# Patient Record
Sex: Male | Born: 1945 | Race: White | Hispanic: No | Marital: Married | Smoking: Former smoker
Health system: Southern US, Community
[De-identification: ages and names within clinical notes are randomized; demographics above are authoritative.]

## PROBLEM LIST (undated history)

## (undated) DIAGNOSIS — E119 Type 2 diabetes mellitus without complications: Secondary | ICD-10-CM

## (undated) DIAGNOSIS — J42 Unspecified chronic bronchitis: Secondary | ICD-10-CM

## (undated) DIAGNOSIS — E7800 Pure hypercholesterolemia, unspecified: Secondary | ICD-10-CM

## (undated) DIAGNOSIS — I10 Essential (primary) hypertension: Secondary | ICD-10-CM

## (undated) DIAGNOSIS — G629 Polyneuropathy, unspecified: Secondary | ICD-10-CM

## (undated) DIAGNOSIS — B1920 Unspecified viral hepatitis C without hepatic coma: Secondary | ICD-10-CM

## (undated) HISTORY — PX: BACK SURGERY: SHX140

## (undated) HISTORY — DX: Type 2 diabetes mellitus without complications: E11.9

## (undated) HISTORY — PX: SHOULDER SURGERY: SHX246

## (undated) HISTORY — DX: Pure hypercholesterolemia, unspecified: E78.00

## (undated) HISTORY — PX: CHOLECYSTECTOMY: SHX55

## (undated) HISTORY — PX: SPINAL CORD STIMULATOR INSERTION: SHX5378

---

## 1999-10-04 ENCOUNTER — Encounter: Payer: Self-pay | Admitting: Emergency Medicine

## 1999-10-04 ENCOUNTER — Emergency Department (HOSPITAL_COMMUNITY): Admission: EM | Admit: 1999-10-04 | Discharge: 1999-10-04 | Payer: Self-pay | Admitting: Emergency Medicine

## 1999-10-27 ENCOUNTER — Encounter: Payer: Self-pay | Admitting: Orthopedic Surgery

## 1999-10-29 ENCOUNTER — Observation Stay (HOSPITAL_COMMUNITY): Admission: RE | Admit: 1999-10-29 | Discharge: 1999-10-30 | Payer: Self-pay | Admitting: Orthopedic Surgery

## 2000-02-23 ENCOUNTER — Observation Stay (HOSPITAL_COMMUNITY): Admission: RE | Admit: 2000-02-23 | Discharge: 2000-02-24 | Payer: Self-pay | Admitting: Orthopedic Surgery

## 2001-01-14 ENCOUNTER — Emergency Department (HOSPITAL_COMMUNITY): Admission: EM | Admit: 2001-01-14 | Discharge: 2001-01-14 | Payer: Self-pay | Admitting: Emergency Medicine

## 2001-04-28 ENCOUNTER — Encounter: Payer: Self-pay | Admitting: Orthopedic Surgery

## 2001-04-28 ENCOUNTER — Encounter (INDEPENDENT_AMBULATORY_CARE_PROVIDER_SITE_OTHER): Payer: Self-pay | Admitting: Specialist

## 2001-04-28 ENCOUNTER — Observation Stay (HOSPITAL_COMMUNITY): Admission: RE | Admit: 2001-04-28 | Discharge: 2001-04-30 | Payer: Self-pay | Admitting: Orthopedic Surgery

## 2001-11-07 ENCOUNTER — Encounter: Payer: Self-pay | Admitting: Orthopaedic Surgery

## 2001-11-07 ENCOUNTER — Encounter: Admission: RE | Admit: 2001-11-07 | Discharge: 2001-11-07 | Payer: Self-pay | Admitting: Orthopaedic Surgery

## 2001-11-29 ENCOUNTER — Encounter: Payer: Self-pay | Admitting: Orthopaedic Surgery

## 2001-11-29 ENCOUNTER — Encounter: Admission: RE | Admit: 2001-11-29 | Discharge: 2001-11-29 | Payer: Self-pay | Admitting: Orthopaedic Surgery

## 2002-04-02 ENCOUNTER — Encounter: Admission: RE | Admit: 2002-04-02 | Discharge: 2002-04-17 | Payer: Self-pay

## 2002-04-30 ENCOUNTER — Ambulatory Visit (HOSPITAL_COMMUNITY): Admission: RE | Admit: 2002-04-30 | Discharge: 2002-04-30 | Payer: Self-pay | Admitting: *Deleted

## 2002-04-30 ENCOUNTER — Encounter: Payer: Self-pay | Admitting: *Deleted

## 2002-05-08 ENCOUNTER — Encounter: Payer: Self-pay | Admitting: *Deleted

## 2002-05-08 ENCOUNTER — Ambulatory Visit (HOSPITAL_COMMUNITY): Admission: RE | Admit: 2002-05-08 | Discharge: 2002-05-08 | Payer: Self-pay | Admitting: *Deleted

## 2002-05-14 ENCOUNTER — Encounter: Payer: Self-pay | Admitting: *Deleted

## 2002-05-14 ENCOUNTER — Ambulatory Visit (HOSPITAL_COMMUNITY): Admission: RE | Admit: 2002-05-14 | Discharge: 2002-05-14 | Payer: Self-pay | Admitting: *Deleted

## 2002-06-11 ENCOUNTER — Encounter: Admission: RE | Admit: 2002-06-11 | Discharge: 2002-09-09 | Payer: Self-pay | Admitting: Anesthesiology

## 2002-07-04 ENCOUNTER — Encounter: Payer: Self-pay | Admitting: *Deleted

## 2002-07-04 ENCOUNTER — Ambulatory Visit (HOSPITAL_COMMUNITY): Admission: RE | Admit: 2002-07-04 | Discharge: 2002-07-04 | Payer: Self-pay | Admitting: *Deleted

## 2002-07-11 ENCOUNTER — Encounter: Payer: Self-pay | Admitting: *Deleted

## 2002-07-11 ENCOUNTER — Ambulatory Visit (HOSPITAL_COMMUNITY): Admission: RE | Admit: 2002-07-11 | Discharge: 2002-07-11 | Payer: Self-pay | Admitting: *Deleted

## 2002-07-27 ENCOUNTER — Encounter: Payer: Self-pay | Admitting: Internal Medicine

## 2002-07-27 ENCOUNTER — Encounter: Admission: RE | Admit: 2002-07-27 | Discharge: 2002-07-27 | Payer: Self-pay | Admitting: Internal Medicine

## 2002-09-20 ENCOUNTER — Ambulatory Visit (HOSPITAL_COMMUNITY): Admission: RE | Admit: 2002-09-20 | Discharge: 2002-09-20 | Payer: Self-pay | Admitting: *Deleted

## 2002-09-20 ENCOUNTER — Encounter: Payer: Self-pay | Admitting: *Deleted

## 2002-11-16 ENCOUNTER — Encounter: Payer: Self-pay | Admitting: Gastroenterology

## 2002-11-16 ENCOUNTER — Ambulatory Visit (HOSPITAL_COMMUNITY): Admission: RE | Admit: 2002-11-16 | Discharge: 2002-11-16 | Payer: Self-pay | Admitting: Gastroenterology

## 2002-12-30 ENCOUNTER — Encounter: Admission: RE | Admit: 2002-12-30 | Discharge: 2002-12-30 | Payer: Self-pay | Admitting: Internal Medicine

## 2002-12-30 ENCOUNTER — Encounter: Payer: Self-pay | Admitting: Internal Medicine

## 2003-04-05 ENCOUNTER — Encounter: Payer: Self-pay | Admitting: *Deleted

## 2003-04-05 ENCOUNTER — Ambulatory Visit (HOSPITAL_COMMUNITY): Admission: RE | Admit: 2003-04-05 | Discharge: 2003-04-05 | Payer: Self-pay | Admitting: *Deleted

## 2003-08-07 ENCOUNTER — Ambulatory Visit (HOSPITAL_COMMUNITY): Admission: RE | Admit: 2003-08-07 | Discharge: 2003-08-07 | Payer: Self-pay | Admitting: *Deleted

## 2003-08-12 ENCOUNTER — Ambulatory Visit (HOSPITAL_COMMUNITY): Admission: RE | Admit: 2003-08-12 | Discharge: 2003-08-13 | Payer: Self-pay | Admitting: *Deleted

## 2003-08-21 ENCOUNTER — Inpatient Hospital Stay (HOSPITAL_COMMUNITY): Admission: EM | Admit: 2003-08-21 | Discharge: 2003-08-23 | Payer: Self-pay | Admitting: Emergency Medicine

## 2003-08-24 ENCOUNTER — Ambulatory Visit (HOSPITAL_COMMUNITY): Admission: RE | Admit: 2003-08-24 | Discharge: 2003-08-24 | Payer: Self-pay | Admitting: *Deleted

## 2003-08-26 ENCOUNTER — Encounter (HOSPITAL_COMMUNITY): Admission: RE | Admit: 2003-08-26 | Discharge: 2003-09-16 | Payer: Self-pay | Admitting: *Deleted

## 2003-09-17 ENCOUNTER — Encounter: Admission: RE | Admit: 2003-09-17 | Discharge: 2003-09-30 | Payer: Self-pay | Admitting: *Deleted

## 2003-09-19 ENCOUNTER — Ambulatory Visit (HOSPITAL_COMMUNITY): Admission: RE | Admit: 2003-09-19 | Discharge: 2003-09-19 | Payer: Self-pay | Admitting: *Deleted

## 2003-09-25 ENCOUNTER — Ambulatory Visit (HOSPITAL_COMMUNITY): Admission: RE | Admit: 2003-09-25 | Discharge: 2003-09-25 | Payer: Self-pay | Admitting: *Deleted

## 2003-12-17 ENCOUNTER — Ambulatory Visit (HOSPITAL_COMMUNITY): Admission: RE | Admit: 2003-12-17 | Discharge: 2003-12-17 | Payer: Self-pay | Admitting: *Deleted

## 2004-04-21 ENCOUNTER — Ambulatory Visit: Payer: Self-pay | Admitting: Physician Assistant

## 2004-04-28 ENCOUNTER — Ambulatory Visit: Payer: Self-pay | Admitting: Pain Medicine

## 2004-05-04 ENCOUNTER — Ambulatory Visit: Payer: Self-pay | Admitting: Pain Medicine

## 2004-05-22 ENCOUNTER — Ambulatory Visit: Payer: Self-pay | Admitting: Physician Assistant

## 2004-06-23 ENCOUNTER — Ambulatory Visit: Payer: Self-pay | Admitting: Physician Assistant

## 2004-07-09 ENCOUNTER — Ambulatory Visit: Payer: Self-pay | Admitting: Physician Assistant

## 2004-07-21 ENCOUNTER — Ambulatory Visit: Payer: Self-pay | Admitting: Pain Medicine

## 2004-08-27 ENCOUNTER — Ambulatory Visit: Payer: Self-pay | Admitting: Physician Assistant

## 2004-09-20 ENCOUNTER — Emergency Department (HOSPITAL_COMMUNITY): Admission: EM | Admit: 2004-09-20 | Discharge: 2004-09-20 | Payer: Self-pay | Admitting: Emergency Medicine

## 2004-09-29 ENCOUNTER — Ambulatory Visit (HOSPITAL_COMMUNITY): Admission: RE | Admit: 2004-09-29 | Discharge: 2004-09-29 | Payer: Self-pay | Admitting: Internal Medicine

## 2004-10-12 ENCOUNTER — Ambulatory Visit: Payer: Self-pay | Admitting: Pain Medicine

## 2004-10-15 ENCOUNTER — Ambulatory Visit: Payer: Self-pay | Admitting: Pain Medicine

## 2004-11-17 ENCOUNTER — Ambulatory Visit: Payer: Self-pay | Admitting: Physician Assistant

## 2004-11-26 ENCOUNTER — Ambulatory Visit: Payer: Self-pay | Admitting: Pain Medicine

## 2004-12-17 ENCOUNTER — Ambulatory Visit: Payer: Self-pay | Admitting: Physician Assistant

## 2005-01-15 ENCOUNTER — Ambulatory Visit: Payer: Self-pay | Admitting: Physician Assistant

## 2005-02-15 ENCOUNTER — Ambulatory Visit: Payer: Self-pay | Admitting: Physician Assistant

## 2005-02-26 ENCOUNTER — Ambulatory Visit: Payer: Self-pay | Admitting: Pain Medicine

## 2005-03-15 ENCOUNTER — Ambulatory Visit: Payer: Self-pay | Admitting: Physician Assistant

## 2005-04-20 ENCOUNTER — Ambulatory Visit: Payer: Self-pay | Admitting: Physician Assistant

## 2005-04-29 ENCOUNTER — Ambulatory Visit: Payer: Self-pay | Admitting: Pain Medicine

## 2005-05-12 ENCOUNTER — Ambulatory Visit: Payer: Self-pay | Admitting: Pain Medicine

## 2005-05-25 ENCOUNTER — Ambulatory Visit: Payer: Self-pay | Admitting: Pain Medicine

## 2005-06-09 ENCOUNTER — Ambulatory Visit: Payer: Self-pay | Admitting: Physician Assistant

## 2005-07-08 ENCOUNTER — Ambulatory Visit: Payer: Self-pay | Admitting: Physician Assistant

## 2005-08-06 ENCOUNTER — Ambulatory Visit: Payer: Self-pay | Admitting: Physician Assistant

## 2005-09-06 ENCOUNTER — Ambulatory Visit: Payer: Self-pay | Admitting: Physician Assistant

## 2005-10-01 IMAGING — CT CT ANGIO ABDOMEN
1 of 4 series · 12 of 32 positions shown, 17 images · IV contrast (omnipaque)
Comparison: None.

CLINICAL DATA: One week post-back surgery at L4-5 with fever.
CT ANGIO OF THE ABDOMEN ([DATE])
TECHNIQUE: After the intravenous injection of 150 cc of Omnipaque 350, 1 mm spiral images were obtained through the abdomen and pelvis.  The images were reconstructed in 3-D and multiplanar views.

[Series 104: renal/sma cta · axial · 0.79mm/px · z∈[-427,-95]mm · 12 of 610 slices shown, 17 images]
[im 41/610  soft-tissue]
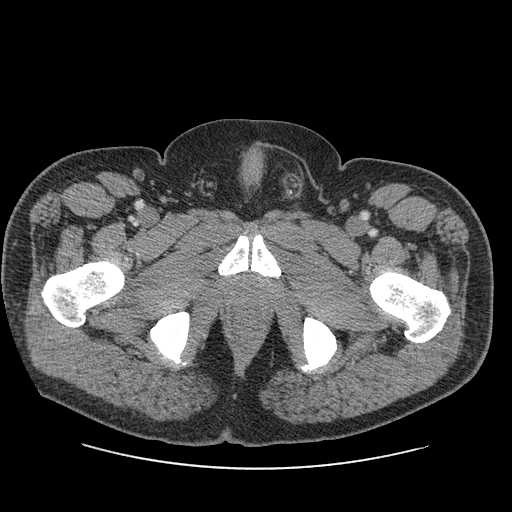
[im 41/610  bone]
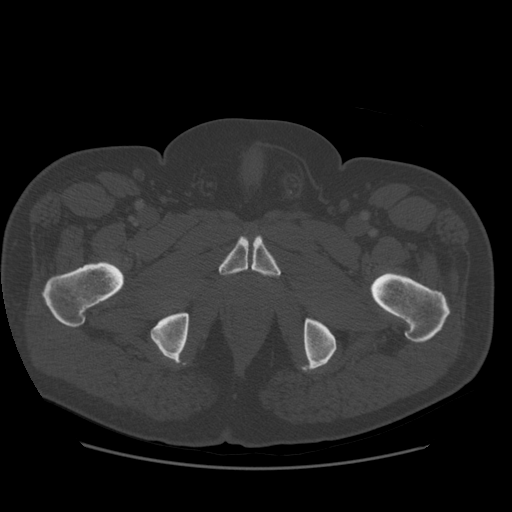
[im 82/610  soft-tissue]
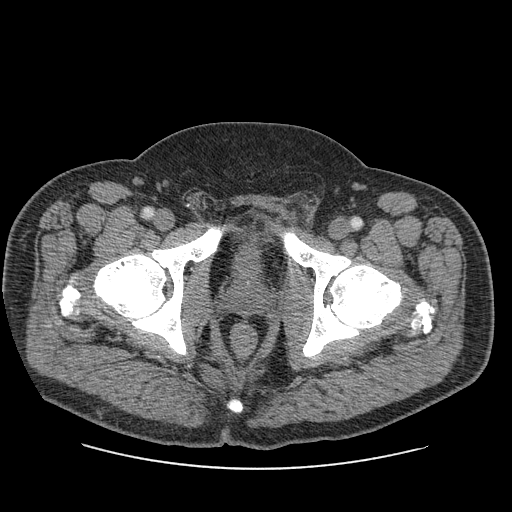
[im 163/610  soft-tissue]
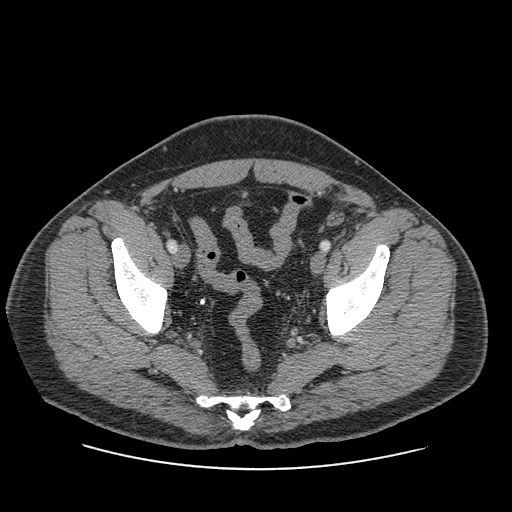
[im 204/610  soft-tissue]
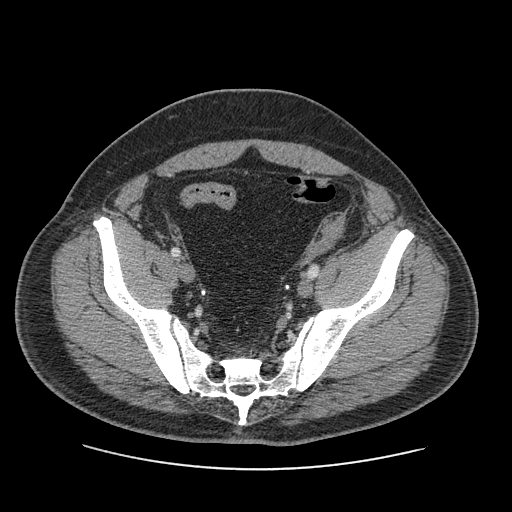
[im 244/610  soft-tissue]
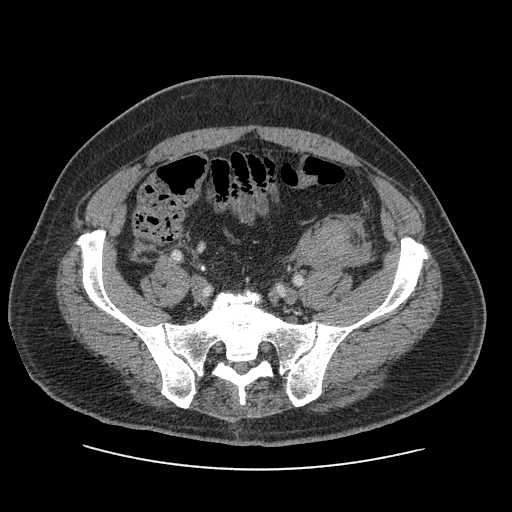
[im 325/610  soft-tissue]
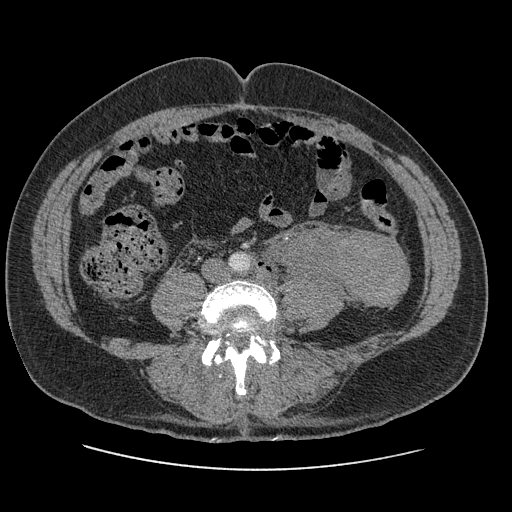
[im 366/610  soft-tissue]
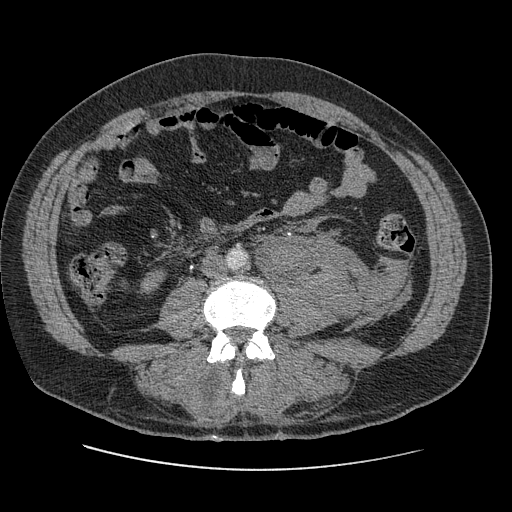
[im 407/610  soft-tissue]
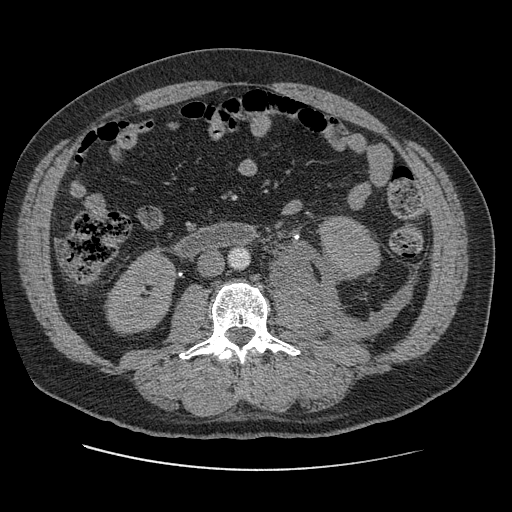
[im 447/610  soft-tissue]
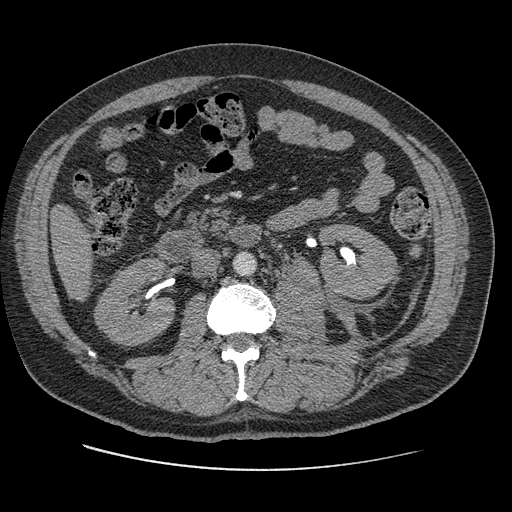
[im 447/610  lung]
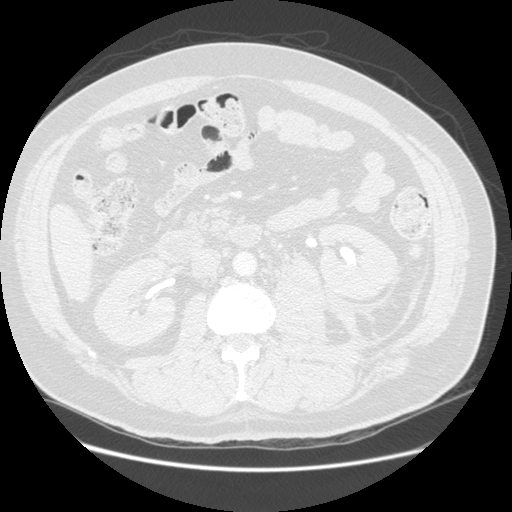
[im 447/610  bone]
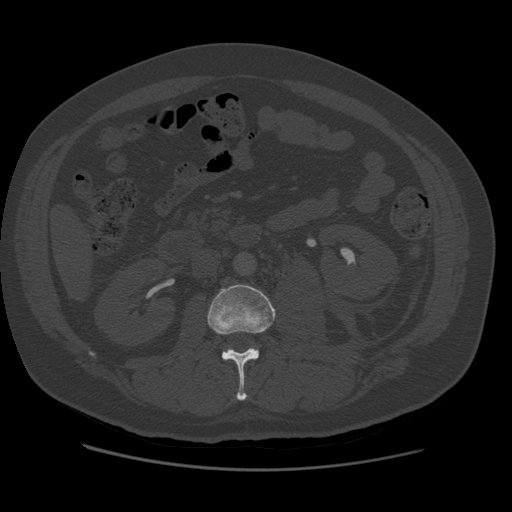
[im 488/610  lung]
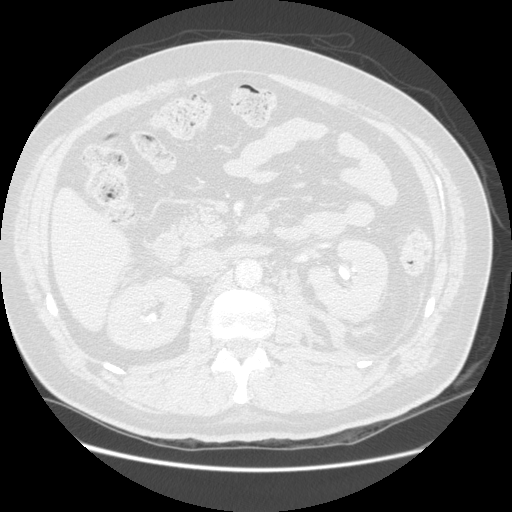
[im 528/610  soft-tissue]
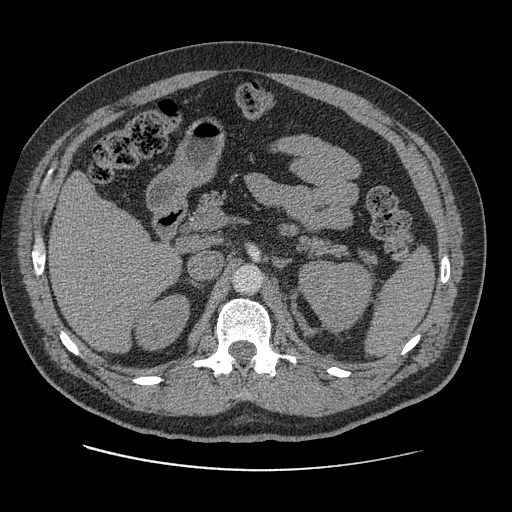
[im 528/610  lung]
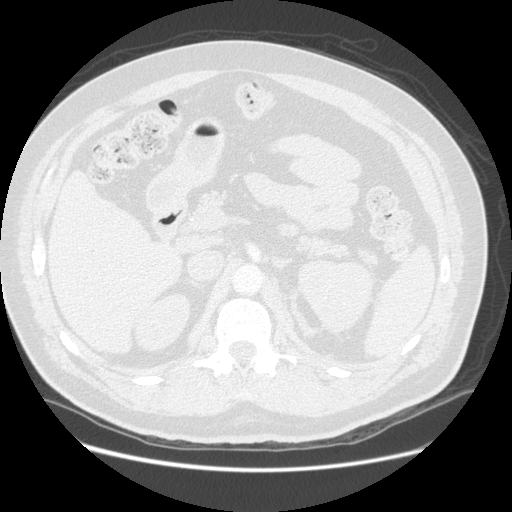
[im 569/610  soft-tissue]
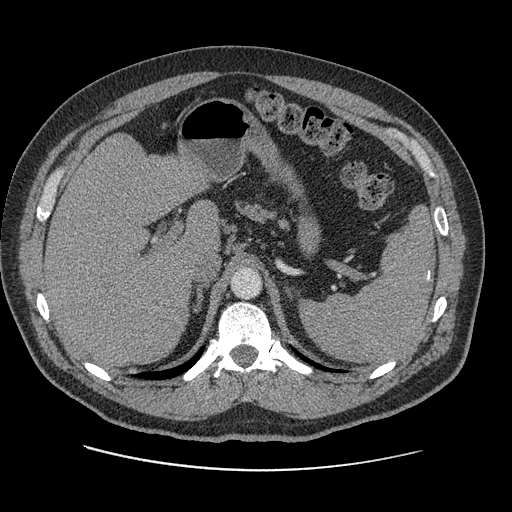
[im 569/610  lung]
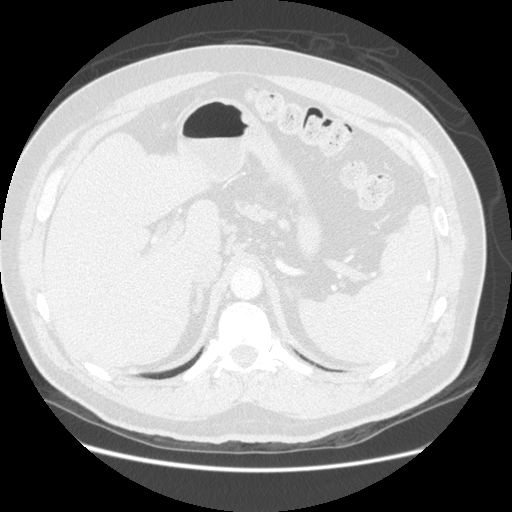

[12 of 32 positions shown; findings below may reference images not displayed]

FINDINGS: There is poor opacification of the arterial system significantly limiting this study.  The SMA, celiac, and IMA are patent.  Single renal arteries are patent bilaterally.  The aorta and iliac arteries are patent.  
There is a retroperitoneal hematoma involving primarily the left retroperitoneum and anterior to the left psoas muscle extending to the left kidney.  The soft tissue stranding associated with the hematoma is also in the left para-aortic region and contiguous with the left common iliac artery.  Small areas of gas are seen within this stranding in the left para-aortic region on image 288 of the 1.25 mm section sequence.  Due to poor opacification, pseudoaneurysm from a small lumbar artery cannot be entirely excluded.  In addition, abrupt occlusion of one of the lumbar arteries cannot be determined.  There is no obvious pseudoaneurysm.  There is no obvious contrast extravasation.  The stranding and hematoma also does extend to the left common iliac vein.  A hematoma measures 12.0 x 5.5 cm on image 297.    The left retroperitoneal hematoma is 12.0 x 5.5 cm on image 279 of the 1 mm sequence.  
There is an approximately 1.2 x 0.4 cm metallic object anterior and to the left of the left L4-5 disc space.  The metallic object is in close proximity to the left common iliac vein and is just deep and posterior to the left common iliac artery.  Stranding is seen about this area compatible with hematoma.  A small amount of gas is also seen which may simply represent post-surgical change.  Post-surgical changes are noted in the posterior elements and discs at L4-5.
The visualized portions of the liver, spleen, adrenal glands, pancreas are within normal limits.  There is a 1.6 x 1.0 cm hypodensity in the lower pole of the right kidney on image 365.  It is somewhat oblong.  Hounsfield measurements are approximately 40.  This may represent a hyperdense cyst.  However, renal mass is not excluded.  A subcentimeter hypodensity is seen in the upper pole of the left kidney on image 327, too small to characterize.  On precontrast images, the hypodensity in the lower pole of the right kidney has Hounsfield unit measurements of approximately 40.  This is most compatible with a hyperdense cyst.
IMPRESSION 
Postsurgical change at L4-5.
Retroperitoneal hematoma.  Vascular opacification is limited, and although no obvious pseudoaneurysm or extravasation is seen, subtle pseudoaneurysm or subtle contrast extravasation cannot be entirely excluded.  Conventional digital subtraction angiography can further delineate pathology.  The hematoma does come into close proximity of the left common iliac artery and the lumbar arteries as well as associated venous structures in the left para-aortic region.
Small metallic object anterior to the L4-5 disc space.  This is just deep to the left common iliac artery and is also just lateral to the left common iliac vein.
Hyperdense cyst in the lower pole of the right kidney.  Follow-up in 6 months is recommended to ensure stability.
These findings were discussed with Dr. Selma Mellen.

## 2005-10-01 IMAGING — CT CT L SPINE W/O CM
2 series · 10 of 14 positions shown, 12 images · non-contrast
Comparison: none

CLINICAL DATA: One week post-back surgery at L4-5 with fever. 
CT LUMBAR SPINE WITHOUT CONTRAST ([DATE])
TECHNIQUE: 2.5 mm spiral images were obtained through the lumbar spine.

[Series 2: l-spine · axial · 0.27mm/px · z∈[-376,-244]mm · 5 of 80 slices shown, 7 images]
[im 14/80  soft-tissue]
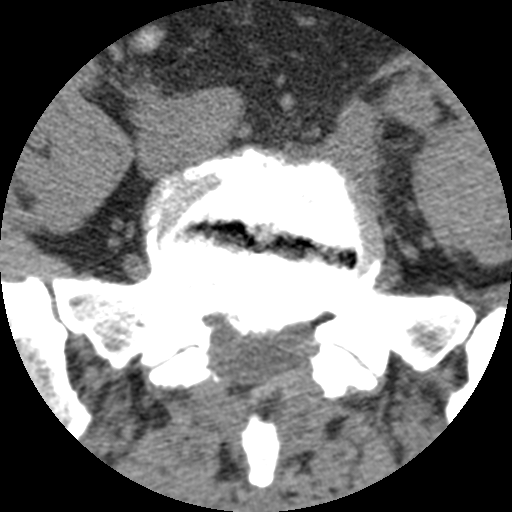
[im 14/80  bone]
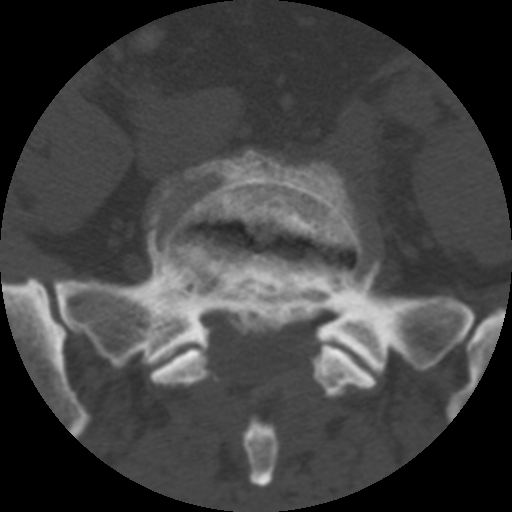
[im 27/80  bone]
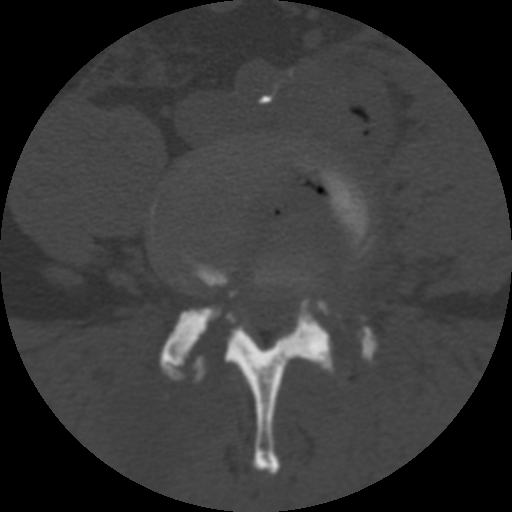
[im 40/80  bone]
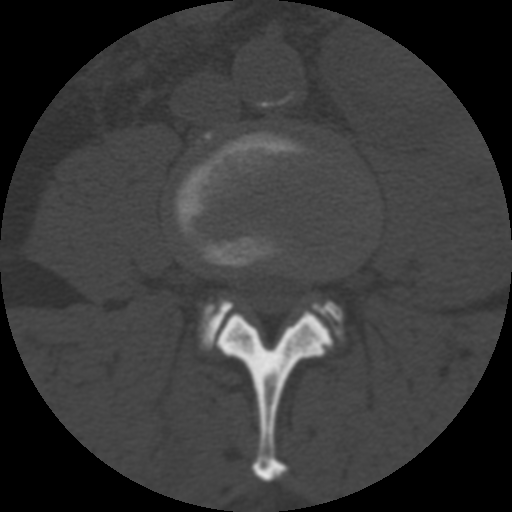
[im 53/80  bone]
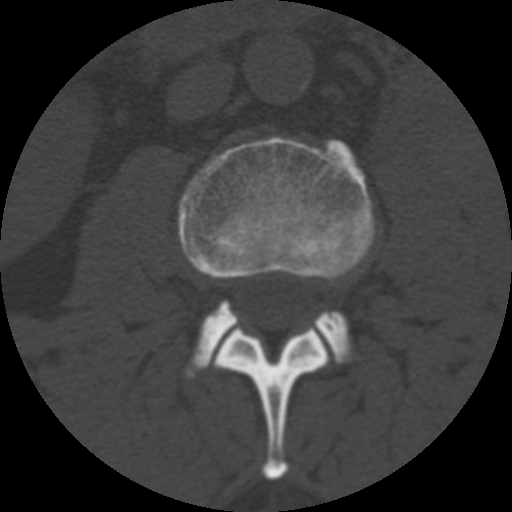
[im 66/80  soft-tissue]
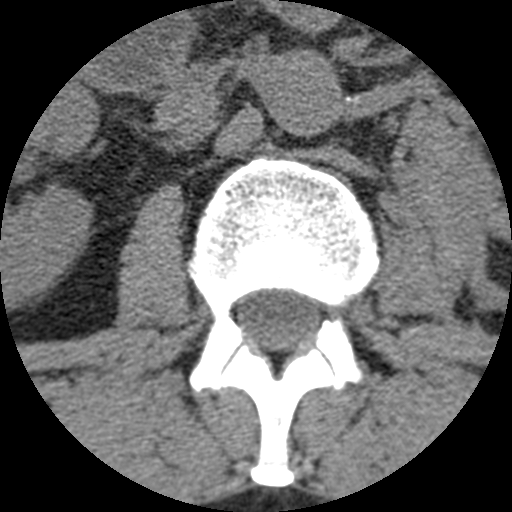
[im 66/80  bone]
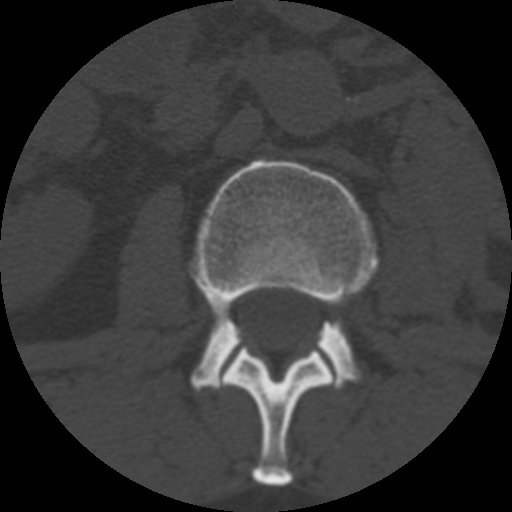

[Series 3: recon 2: l-spine · axial · 0.27mm/px · z∈[-376,-244]mm · 5 of 80 slices shown]
[im 14/80  bone]
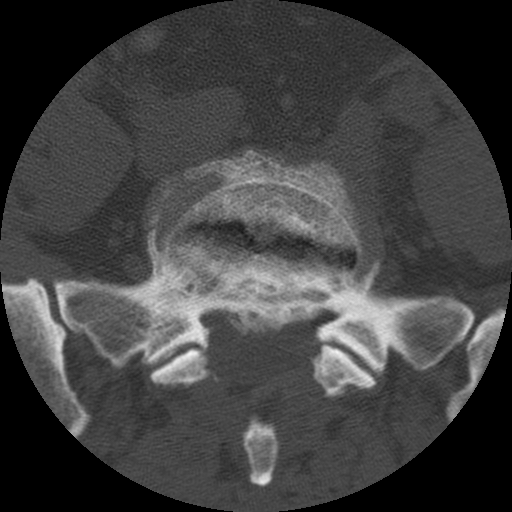
[im 27/80  bone]
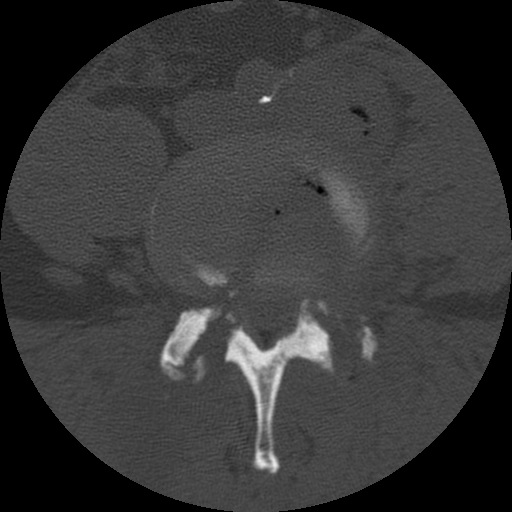
[im 40/80  bone]
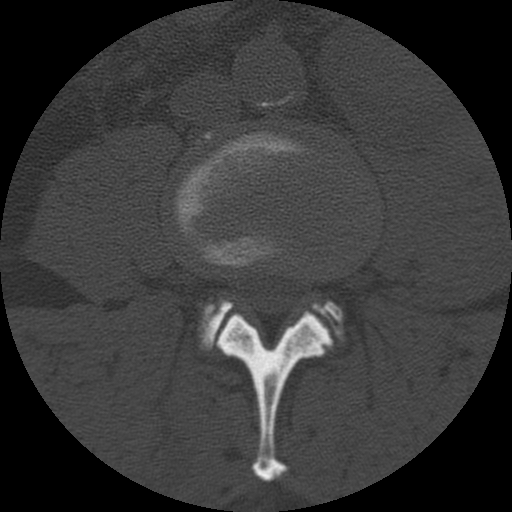
[im 53/80  bone]
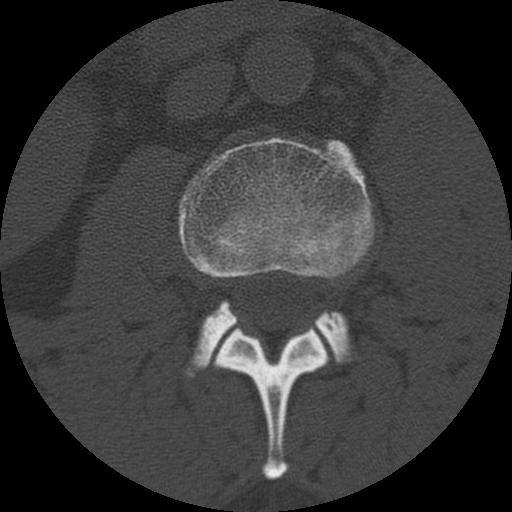
[im 66/80  bone]
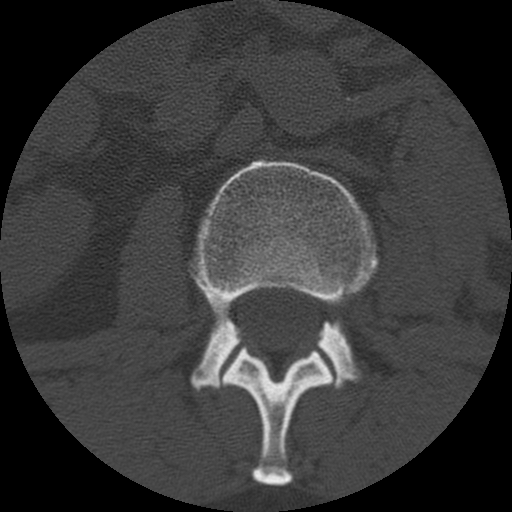

[10 of 14 positions shown; findings below may reference images not displayed]

FINDINGS: Five non-rib bearing lumbar vertebral bodies are identified.  Mild anterolisthesis of L-4 upon L-5 is seen as previously visualized.  Moderate loss of height at L4-5 is seen.  Significant loss of height at L5-S1 is seen.  These findings are not significantly changed. 
L1-2, L2-3, and L3-4 levels are within normal limits without central canal stenosis or disc herniation. 
At L4-5, there has been resection of a portion of the right L-4 lamina and medial facet.  A portion of the left L-4 lamina and medial facet has also been resected.  A considerable amount of the anterior right facet osteophytes have been resected.  Left anterior L4-5 osteophytes are again noted.  Concentric disc bulge is seen.  Tunnel tracts are seen through the L4-5 disc compatible with discectomy.  Central canal stenosis is not significantly changed.  Bilateral foraminal stenosis is again identified and this may be due to edema within the adjacent discs and soft tissues.  Bilateral foraminal narrowing is multifactorial.  Within the right neural foramen, there has been a reduction in the amount of facet osteophytes, however, the disc bulging continues to encroach upon the L-4 nerve root.  This is also seen in the left neural foramen.  There is a 0.4 x 1.2 cm metallic object anterior to the L4-5 disc slightly to the left just posterior to the left common iliac artery.  There is soft tissue density in the soft tissues and mass effect extending into the left psoas muscle worrisome for hematoma.  Gas is also seen in within this density.  There is a 3.7 x 2.8 cm fluid collection in the right paraspinal musculature at the L4-5 level.  
Concentric disc bulge asymmetric to the right and bilateral foraminal narrowing is again seen at L5-S1.  This is not significantly changed. 
IMPRESSION
1.  Degenerative disc disease and foraminal narrowing at L5-S1 is not significantly changed.
2.  Post-surgical change at L4-5 is seen as described.  
3.  There is a small metallic object anterior to the L4-5 disc in the retroperitoneal tissue.  This is associated with a suspected retroperitoneal hematoma.  Gas is also seen in the soft tissues and this may reflect post-surgical change. 
4.  Fluid collection in the right paraspinal musculature at the operative site.

## 2005-10-04 IMAGING — XA IR US GUIDE VASC ACCESS RIGHT
1 series · 1 of 1 positions shown · non-contrast
Comparison: none

CLINICAL DATA: Back pain.  Postop infection.  Needs antibiotics.  
 FLUOROSCOPIC AND ULTRASOUND GUIDED RIGHT PICC LINE PLACEMENT 08/24/03
TECHNIQUE: The right arm was prepped with Betadine, draped in the usual sterile fashion, and infiltrated locally with 1% lidocaine.  Ultrasound demonstrated patency of the right basilic vein.  Under real-time ultrasound guidance, this vein was accessed with a 21-gauge micropuncture needle.  Ultrasound image documentation was performed.  The needle was exchanged over a guidewire for a peel-away sheath, through which a 5 French single lumen PICC catheter trimmed to 45 cm was advanced, positioned with its tip at the distal SVC/right atrial junction.  Fluoroscopy during the procedure and fluoro spot radiograph confirms appropriate catheter tip position.  The catheter was flushed, secured to the skin with Prolene sutures, and covered with a sterile dressing.  No immediate complication. 
 IMPRESSION
 Technically successful right arm PICC placement with ultrasound and fluoroscopic guidance.  Ready for routine use.

[Series 1: run · 1 of 1 slices shown]
[im 1/1]
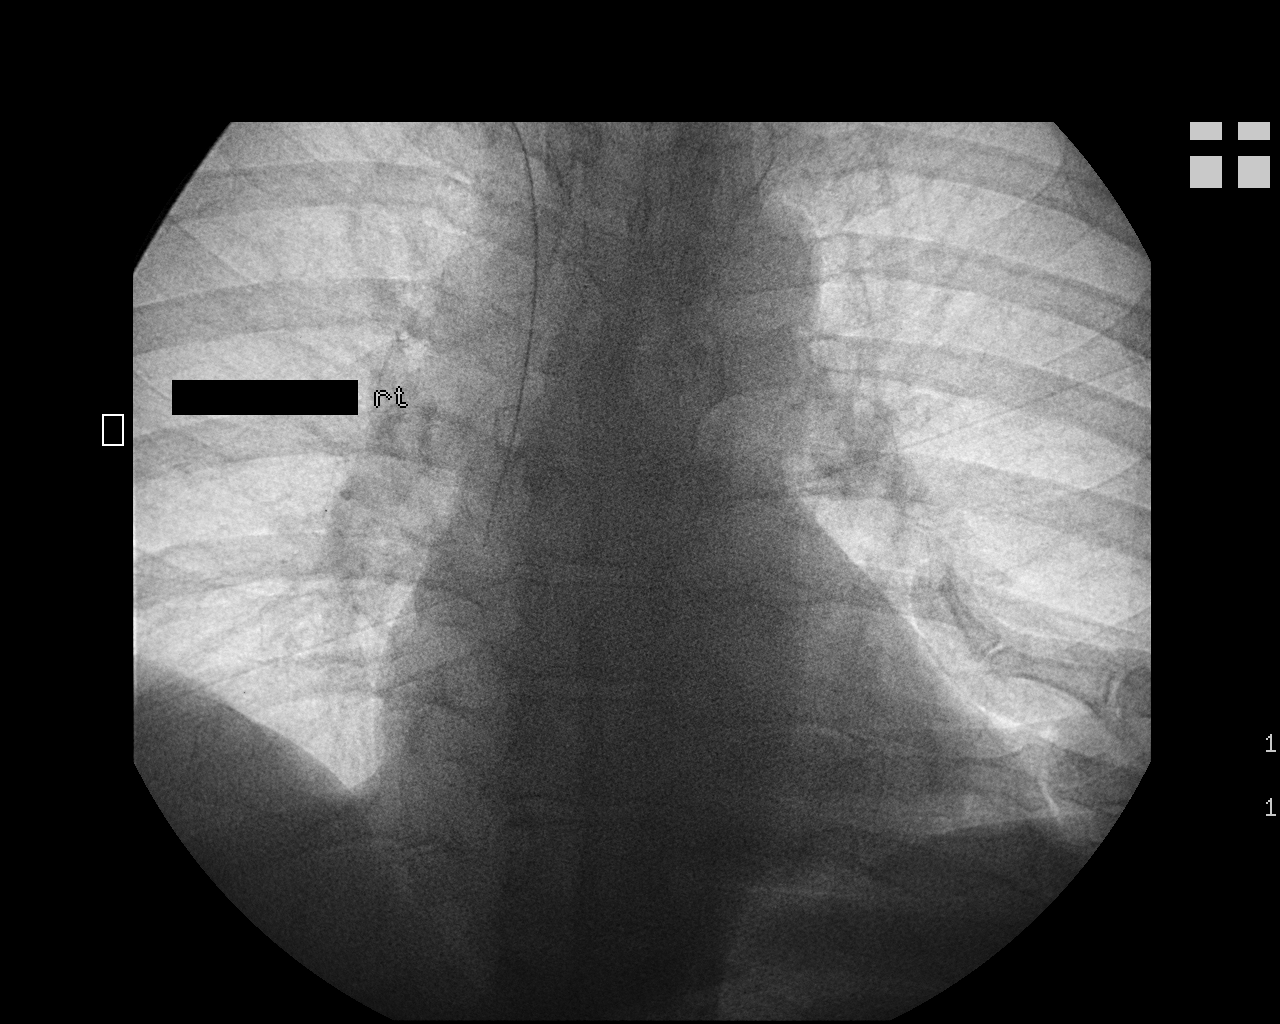

[1 of 1 positions shown; findings below may reference images not displayed]

## 2005-10-05 ENCOUNTER — Ambulatory Visit: Payer: Self-pay | Admitting: Physician Assistant

## 2005-10-15 ENCOUNTER — Ambulatory Visit: Payer: Self-pay | Admitting: Gastroenterology

## 2005-11-04 ENCOUNTER — Ambulatory Visit: Payer: Self-pay | Admitting: Physician Assistant

## 2005-11-30 ENCOUNTER — Ambulatory Visit: Payer: Self-pay | Admitting: Physician Assistant

## 2005-12-31 ENCOUNTER — Ambulatory Visit: Payer: Self-pay | Admitting: Physician Assistant

## 2006-01-31 ENCOUNTER — Ambulatory Visit: Payer: Self-pay | Admitting: Physician Assistant

## 2006-02-25 ENCOUNTER — Ambulatory Visit: Payer: Self-pay | Admitting: Physician Assistant

## 2006-03-24 ENCOUNTER — Ambulatory Visit: Payer: Self-pay | Admitting: Pain Medicine

## 2006-04-26 ENCOUNTER — Ambulatory Visit: Payer: Self-pay | Admitting: Physician Assistant

## 2006-06-01 ENCOUNTER — Ambulatory Visit: Payer: Self-pay | Admitting: Physician Assistant

## 2006-06-14 ENCOUNTER — Ambulatory Visit: Payer: Self-pay | Admitting: Pain Medicine

## 2006-06-29 ENCOUNTER — Ambulatory Visit: Payer: Self-pay | Admitting: Pain Medicine

## 2006-07-29 ENCOUNTER — Ambulatory Visit: Payer: Self-pay | Admitting: Physician Assistant

## 2006-08-29 ENCOUNTER — Ambulatory Visit: Payer: Self-pay | Admitting: Physician Assistant

## 2006-09-28 ENCOUNTER — Ambulatory Visit: Payer: Self-pay | Admitting: Physician Assistant

## 2006-10-31 ENCOUNTER — Ambulatory Visit: Payer: Self-pay | Admitting: Physician Assistant

## 2006-11-04 ENCOUNTER — Ambulatory Visit: Payer: Self-pay | Admitting: Physician Assistant

## 2006-11-30 ENCOUNTER — Ambulatory Visit: Payer: Self-pay | Admitting: Physician Assistant

## 2006-12-29 ENCOUNTER — Ambulatory Visit: Payer: Self-pay | Admitting: Physician Assistant

## 2007-01-26 ENCOUNTER — Ambulatory Visit: Payer: Self-pay | Admitting: Physician Assistant

## 2007-02-27 ENCOUNTER — Ambulatory Visit: Payer: Self-pay | Admitting: Physician Assistant

## 2007-03-27 ENCOUNTER — Ambulatory Visit: Payer: Self-pay | Admitting: Physician Assistant

## 2007-04-26 ENCOUNTER — Ambulatory Visit: Payer: Self-pay | Admitting: Physician Assistant

## 2007-05-24 ENCOUNTER — Ambulatory Visit: Payer: Self-pay | Admitting: Physician Assistant

## 2007-08-28 ENCOUNTER — Ambulatory Visit: Payer: Self-pay | Admitting: Physician Assistant

## 2007-09-06 ENCOUNTER — Ambulatory Visit: Payer: Self-pay | Admitting: Pain Medicine

## 2007-09-14 ENCOUNTER — Ambulatory Visit: Payer: Self-pay | Admitting: Gastroenterology

## 2007-11-28 ENCOUNTER — Ambulatory Visit: Payer: Self-pay | Admitting: Physician Assistant

## 2008-02-28 ENCOUNTER — Ambulatory Visit: Payer: Self-pay | Admitting: Physician Assistant

## 2008-04-29 ENCOUNTER — Ambulatory Visit: Payer: Self-pay | Admitting: Physician Assistant

## 2008-06-26 ENCOUNTER — Emergency Department (HOSPITAL_COMMUNITY): Admission: EM | Admit: 2008-06-26 | Discharge: 2008-06-26 | Payer: Self-pay | Admitting: Emergency Medicine

## 2008-08-01 ENCOUNTER — Ambulatory Visit: Payer: Self-pay | Admitting: Physician Assistant

## 2008-09-17 ENCOUNTER — Ambulatory Visit: Payer: Self-pay | Admitting: Gastroenterology

## 2008-10-31 ENCOUNTER — Ambulatory Visit: Payer: Self-pay | Admitting: Physician Assistant

## 2008-11-27 ENCOUNTER — Ambulatory Visit: Payer: Self-pay | Admitting: Physician Assistant

## 2008-12-25 ENCOUNTER — Encounter: Admission: RE | Admit: 2008-12-25 | Discharge: 2008-12-25 | Payer: Self-pay | Admitting: Sports Medicine

## 2008-12-31 ENCOUNTER — Ambulatory Visit: Payer: Self-pay | Admitting: Gastroenterology

## 2009-01-16 ENCOUNTER — Ambulatory Visit: Payer: Self-pay | Admitting: Gastroenterology

## 2009-02-06 ENCOUNTER — Ambulatory Visit: Payer: Self-pay | Admitting: Gastroenterology

## 2009-03-13 ENCOUNTER — Ambulatory Visit: Payer: Self-pay | Admitting: Gastroenterology

## 2009-04-01 ENCOUNTER — Ambulatory Visit: Payer: Self-pay | Admitting: Gastroenterology

## 2009-05-08 ENCOUNTER — Ambulatory Visit: Payer: Self-pay | Admitting: Gastroenterology

## 2009-12-11 ENCOUNTER — Emergency Department (HOSPITAL_COMMUNITY): Admission: EM | Admit: 2009-12-11 | Discharge: 2009-12-11 | Payer: Self-pay | Admitting: Emergency Medicine

## 2010-08-08 ENCOUNTER — Encounter: Payer: Self-pay | Admitting: *Deleted

## 2010-08-09 ENCOUNTER — Encounter: Payer: Self-pay | Admitting: Gastroenterology

## 2010-12-04 NOTE — Op Note (Signed)
[facility]  Patient:    Xavier White, Xavier White Visit Number: [id number] MRN: [id number]          Service Type: OBV Location: 4W 0448 01 Attending Physician:  Skip Mayer Proc. Date: 04/28/01 Admit Date:  04/28/2001                             Operative Report  SURGEON:  Windy Fast A. Gioffre, M.D.  ASSISTANT:  Philips J. Montez Morita, M.D.  PREOPERATIVE DIAGNOSES: 1. A large herniated lumbar disk with lateral recessed stenosis bilaterally at    L5-S1. 2. Previous herniated disk at L5-S1 on the right 20 years ago,    operated on by another Careers adviser.  He was totally asymptomatic until a    recent injury at work according to his history.  OPERATION: 1. Bilateral hemilaminectomy at L5-S1. 2. Foraminotomies of the S1 root at L5-S1 bilaterally. 3. Decompression of the lateral recesses bilaterally for lateral recessed    stenosis. 4. Microdiskectomy at L5-S1 bilaterally.  DESCRIPTION OF PROCEDURE:  Under general anesthesia, the patient on a spinal frame, a routine orthopedic prepping and draping of the back carried out.  The patient then given 1 gram of IV Ancef preop.  At this time, an incision was made through the old incision site.  Bleeders identified and cauterized.  We then stripped the muscle from the spinous process of L5 and the lamina.  We went down and identified the sacrum and the L5 lamina.  An x-ray was taken to verify that we were at the exact position.  We then separated all of the scar tissue from the previous hemilaminectomy site on the right at L5-S1.  We then identified the dura and then went over the left side and did hemilaminectomy on the left and removed ligamentum flavum, identified the dura and the S1 root bilaterally.  We did foraminotomies bilaterally at this time.  We brought the microscope in and did decompression of our lateral recesses.  The S1 root on the left was nice and free.  On the right, it was markedly thickened.   The root was scarred down. We dissected the nerve root free.  We then made cruciate incisions on both sides at L5-S1 on the right and left and went down and went into the disk space.  Following this, we did a diskectomy.  The disk was calcified.  It was very hard.  We thoroughly irrigated out the area, made multiple passes with the Epstein curettes as well as the pituitary rongeurs to clean the spaces out.  We were able to free the roots up and the dura up bilaterally.  It appeared when we went on the right, that they must have taken a fat graft and placed over the nerve root because it was markedly thickened and scarred.  Once we completed the procedure, we had good decompression of the nerve root without any difficulty.  The L5-S1 space was very narrow.  I then irrigated the area out, dried it out, and then loosely applied some thrombin-soaked Gelfoam, and I closed the wound in layers in the usual fashion.  Skin was closed with metal staples.  Sterile Neosporin dressings applied. Attending Physician:  Skip Mayer DD:  04/28/01 TD:  04/29/01 Job: [phone]

## 2010-12-04 NOTE — Op Note (Signed)
   NAME:  Xavier White, Xavier White                      ACCOUNT NO.:  [id number]   MEDICAL RECORD NO.:  [id number]                   PATIENT TYPE:  OUT   LOCATION:  XRAY                                 FACILITY:  [facility]   PHYSICIAN:  Ricky D. Gasper Sells, M.D.             DATE OF BIRTH:  10/05/1945   DATE OF PROCEDURE:  09/20/2002  DATE OF DISCHARGE:                                 OPERATIVE REPORT   PROCEDURE:  Left SI joint injection under fluoroscopic control.   SURGEON:  Ricky D. Gasper Sells, M.D.   ASSISTANT:  Nil.   COMPLICATIONS:  Nil.   PROCEDURE:  With the patient's buttocks prepped with Betadine solution  patient's left SI joint was identified and visualized.  A 22-gauge needle  was inserted down to the junction of the SI joint with the pelvis.  Poking  that joint recreated his left-sided pain.  Following that a total of 3 mL  with solution and 2 mL of 0.5% bupivacaine and 1 mL of Depo Medrol 80 mg and  methylprednisolone was then injected in 0.5 mL aliquots.  This significantly  decreased his left-sided discomfort.                                               Ricky D. Gasper Sells, M.D.    RDH/MEDQ  D:  09/20/2002  T:  09/20/2002  Job:  [id number]

## 2010-12-04 NOTE — H&P (Signed)
NAME:  Xavier White, Xavier White                      ACCOUNT NO.:  [id number]   MEDICAL RECORD NO.:  [id number]                   PATIENT TYPE:  OIB   LOCATION:  NA                                   FACILITY:  [facility]   PHYSICIAN:  Ricky D. Gasper Sells, M.D.             DATE OF BIRTH:  1946/02/25   DATE OF ADMISSION:  DATE OF DISCHARGE:                                HISTORY & PHYSICAL   No dictation.                                               Ricky D. Gasper Sells, M.D.    RDH/MEDQ  D:  08/08/2003  T:  08/08/2003  Job:  [id number]

## 2010-12-04 NOTE — Op Note (Signed)
   NAME:  Xavier White, Xavier White                      ACCOUNT NO.:  [id number]   MEDICAL RECORD NO.:  [id number]                   PATIENT TYPE:  OUT   LOCATION:  XRAY                                 FACILITY:  [facility]   PHYSICIAN:  Ricky D. Gasper Sells, M.D.             DATE OF BIRTH:  10/19/1945   DATE OF PROCEDURE:  04/05/2003  DATE OF DISCHARGE:                                 OPERATIVE REPORT   PROCEDURE:  Right sacroiliac joint block under fluoroscopic control.   SURGEON:  Ricky D. Gasper Sells, M.D.   COMPLICATIONS:  None.   DESCRIPTION OF PROCEDURE:  With the patient in the prone position and the  patient's buttocks prepped with Betadine solution.  A 22 gauge needle was  inserted down to the junction of the SI joint and pelvis on the right side.  Palpating this joint recreated the patient's right sided discomfort.  The  joint was then blocked with 3 mL of a solution of 0.5% Bupivacaine, 2 mL  without epinephrine, and 1 mL of Depo-Medrol 80 mg.  This was done in 1 mL  aliquots with aspiration before each injection.  Complete relief of the  patient's right sided SI joint discomfort was obtained.  The patient  tolerated the procedure well.                                               Ricky D. Gasper Sells, M.D.    RDH/MEDQ  D:  04/05/2003  T:  04/05/2003  Job:  [id number]

## 2010-12-04 NOTE — Consult Note (Signed)
NAME:  Xavier White, Xavier White                      ACCOUNT NO.:  [id number]   MEDICAL RECORD NO.:  [id number]                   PATIENT TYPE:  REC   LOCATION:  TPC                                  FACILITY:  [facility]   PHYSICIAN:  Hans C. Stevphen Rochester, M.D.                DATE OF BIRTH:  1946/01/23   DATE OF CONSULTATION:  DATE OF DISCHARGE:                                   CONSULTATION   REASON FOR CONSULTATION:  [facility]  I evaluate him and review health and history form and 14  point review of systems.  I see him with the nurse present in the room.  I  have discussed his care with the nurse throughout the week, we have  attempted to call him on more than one occasion on different days, and have  been unable to reach him.   The patient had been pill sharing with his wife.  This was brought up just  before the drug screen as he related to [place].  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   He has lost one of his patches, and stated it fell off in the yard.  He does  bring one of his patches in a baggie.  He has one on.   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   I am concerned about his children.  He states that he does not use any  drugs, and is remorseful.  I am concerned that we might have to activate  Social Services, and after a long discussion I evaluate the risks, and he  understands that this may be a reality should further drug abuse be  identified.  He understands the implications of this.  He fully was aware of  the MDSU, cooperated, and understood the implications of MDSU with full  informed consent.  He understands that these may be performed in the future.  He also understands that we will not be able to provide him with any  habituating or controlled substances, as first do no harm.  I do not think  withdrawal is going to be an issue, as  I do not believe he has even had his  patches on.  We could bridge him if we needed to, but would need to see him  regularly in the office to assess him, and this is discussed.  We will go  ahead and offer him continued treatment if he wishes, as surgery is  apparently imminent.  We will do what we can to control him, but this would  be in a very strict environment, and we may need the help of an  addictionologist.  I offer him the services of Dr. Ovid Curd,  addictionologist if he would like to move in this  arena, but he states he  does not have a drug problem.  TENS technology is recommended, we will start  him in aquatic therapy.  Lifestyle enhancements are discussed.  He states no  wish to harm self or others.   PHYSICAL EXAMINATION:  NEUROLOGIC:  No significant change.  MUSCULOSKELETAL:  No significant change.   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    PLAN:  Conservative management.  We also offered to refer to another pain  clinic if he would like, and he would like to stay here, and we will do what  we can to help him in this regard.  Should we have a better understanding of  his risk factors, we might reconsider pain control strategy under a very  tight ring, but again we would have to follow him very carefully.  Discharge  instructions given.                                                Hans C. Stevphen Rochester, M.D.    [facility]  D:  04/10/2002  T:  04/10/2002  Job:  [id number]   cc:   Windy Fast A. Darrelyn Hillock, M.D.
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
## 2010-12-04 NOTE — Discharge Summary (Signed)
NAME:  Xavier White, Xavier White                      ACCOUNT NO.:  [id number]   MEDICAL RECORD NO.:  [id number]                   PATIENT TYPE:  OIB   LOCATION:  6729                                 FACILITY:  [facility]   PHYSICIAN:  Ricky D. Gasper Sells, M.D.             DATE OF BIRTH:  [birth year]   DATE OF ADMISSION:  08/12/2003  DATE OF DISCHARGE:  08/13/2003                                 DISCHARGE SUMMARY   ADMISSION DIAGNOSIS:  Spinal stenosis L4-5, status post remote lumbar  laminectomy.   DISCHARGE DIAGNOSES:  1. L4-5 microsurgical diskectomy and laminectomy for decompression of the     lateral recesses and removal of disk, more difficult than average, four     hours of fluoroscopy.  2. Surgeon; Clide Cliff D. Gasper Sells, M.D. and assistant; Izell Morgan. Elesa Hacker, M.D.   HISTORY OF PRESENT ILLNESS:  This is a 65 year old gentleman on whom I  performed a lumbar laminectomy in 2003.  He has developed spinal stenosis at  the L4-5 level and a recurrent disk at that level and became symptomatic  probably when he ruptured the disk.  This was more prominent on the left  than the right on the myelogram and also clinically.  There is a clear cut  left L5 radiculopathy with a positive straight leg raise more so on the left  than the right with a positive right straight leg raise across to the left,  as well as right leg pain.   HOSPITAL COURSE:  He was brought in for a minimally invasive approach to the  L4-5 level.  This was carried out on the left side without difficulty until  one the pituitaries broke and a small fragment of metal was lodged in the  soft tissues anteriorly in the disk.  I was not able to fish this out  despite heroic efforts to do so, and it ended up near the anterior  longitudinal ligament, but on the disk side of it.  Rather than pursue into  the retroperitoneum or prevertebral space which would be exceedingly  dangerous, it was decided to leave it.  I did not feel it presented  imminent  danger to the patient. It will be followed with repeat AP and lateral lumbar  spines postoperatively to make sure it is not moving.   Probably, it would be much safer to take this thing out from the front  through an extraperitoneal approach rather than go at it from the back and  possibly at that time consider putting a BAK cage in.  We will cross that  bridge when we get to it.   Neurologically he was improved postoperatively from preoperatively.  His  left leg weakness, numbness, and tingling in the dorsum of the foot was  gone.  Straight leg raises were negative.  He did have some pain in the  region of the left hip.  This was probably post decompressive.  He had no  weakness in the lower extremities.  He was able to ambulate well on his own,  tolerate oral feeds.  His vital signs are stable.  His incisions look  benign.  He was discharged home.  He was given Talwin NX on a p.r.n. basis  for pain.  He was given my phone number to call me on a p.r.n. basis for any  difficulties.  I will see him in my office in eight days' time for follow-  up.   CONDITION ON DISCHARGE:  Markedly improved.   FINAL DIAGNOSES:  Status post bilateral L4-5 diskectomy.                                                Ricky D. Gasper Sells, M.D.    RDH/MEDQ  D:  08/13/2003  T:  08/14/2003  Job:  [id number]

## 2010-12-04 NOTE — Op Note (Signed)
NAME:  Xavier White, Xavier White                      ACCOUNT NO.:  [id number]   MEDICAL RECORD NO.:  [id number]                   PATIENT TYPE:  OUT   LOCATION:  XRAY                                 FACILITY:  [facility]   PHYSICIAN:  Ricky D. Gasper Sells, M.D.             DATE OF BIRTH:  [birth year]   DATE OF PROCEDURE:  09/25/2003  DATE OF DISCHARGE:                                 OPERATIVE REPORT   PROCEDURE:  Left sacroiliac joint block under fluoroscopic control.   PROCEDURALIST:  Ricky D. Gasper Sells, M.D.   COMPLICATIONS:  Nil.   SUMMARY:  This is a 65 year old male with known bilateral SI joint pain,  clinically significant.  His left side normally is worse than his right but  on this occasion, the right side appears to be worse than the left.   PROCEDURE:  With the patient in the prone position, with the patient's  buttocks prepped with Betadine solution, a 22-gauge needle was inserted down  to the junction of the SI joint to the pelvis.  Poking this joint re-created  the patient's discomfort, particularly on the sacral side of the joint.  I  then blocked the joint with 2 mL of 0.5% bupivacaine and 1 mL of Depo-Medrol  in 0.5-cc aliquots.  This significantly decreased the patient's SI joint-  related pain.   His overall pain response was at least a 50% reduction in his overall low  back pain.   Recall, this patient disabled from low back pain.  This disability preceded  his January surgery.                                               Ricky D. Gasper Sells, M.D.    RDH/MEDQ  D:  09/25/2003  T:  09/25/2003  Job:  [id number]   cc:   Cliffton Asters, M.D.  [address] [place]  [place] [address]  Fax: [phone]

## 2010-12-04 NOTE — Op Note (Signed)
NAME:  Xavier White, MCCONAHA                      ACCOUNT NO.:  [id number]   MEDICAL RECORD NO.:  [id number]                   PATIENT TYPE:  OUT   LOCATION:  XRAY                                 FACILITY:  [facility]   PHYSICIAN:  Ricky D. Gasper Sells, M.D.             DATE OF BIRTH:  09/13/1945   DATE OF PROCEDURE:  09/19/2003  DATE OF DISCHARGE:                                 OPERATIVE REPORT   PROCEDURE:  Right sacroiliac joint block under fluoroscopic control.   SURGEON:  Ricky D. Gasper Sells, M.D.   COMPLICATIONS:  Nil.   DESCRIPTION OF PROCEDURE:  With the patient in the prone position with the  patient's buttocks prepped with Betadine solution, a 22-gauge needle was  inserted down to the junction of the SI joint with the pelvis.  Poking this  joint recreated the patient's discomfort.  The joint was then blocked with a  solution of 2 mL of 0.5% bupivacaine and 1 mL of Depo-Medrol (80 mg of  methylprednisolone).  This significantly decreased his right-sided SI joint  discomfort, and the numbness and tingling he had been experiencing diffusely  in the right leg disappeared completely.   His back pain, however, remained to a moderate degree.  This indicates that  a significant amount of discomfort is coming from his SI joint but there is  back pain that is independent of that.  His leg symptoms seem to be more SI  joint-related than back pain-related.                                               Ricky D. Gasper Sells, M.D.    RDH/MEDQ  D:  09/19/2003  T:  09/19/2003  Job:  [id number]   cc:   Cliffton Asters, M.D.  [address] [place]  [place] [address]  Fax: [phone]

## 2010-12-04 NOTE — Op Note (Signed)
[place]  Patient:    Xavier White, Xavier White                     MRN: [id number] Proc. Date: 02/23/00 Attending:  Georges Lynch. Darrelyn Hillock, M.D.                           Operative Report  SURGEON:  Georges Lynch. Darrelyn Hillock, M.D.  ASSISTANTDruscilla Brownie. Underwood III, P.A.  PREOPERATIVE DIAGNOSIS:  Complete tear of the left rotator cuff tendon.  POSTOPERATIVE DIAGNOSIS:  Complete tear of the left rotator cuff tendon.  OPERATION: 1. A partial acromionectomy and acromioplasty. 2. Repair of a large tear in the rotator cuff tendon, utilizing three    multi-tack anchors.  DESCRIPTION OF PROCEDURE:  Under general anesthesia, a routine orthopedic prep  and drape of the left shoulder was carried out.  The patient received 1 g of IV Ancef.  At this time an incision was made over the anterior aspect of the acromion, and the deltoid muscle.  Bleeders were identified and cauterized.  I then went down and identified the acromion and stripped the deltoid tendon off by sharp dissection.  He had a large overgrowth of his acromion.  I did a partial acromionectomy and acromioplasty utilizing the oscillating saw and a bur.  Once this was done, I then removed the subdeltoid bursa.  He had a large hole the size of a 50-cent piece in his rotator cuff.  I gently retracted the edges of the cuff and utilized the Congress and burred off the lateral articular surface of the humerus and made a nice groove in the humerus.  At this time we thoroughly irrigated out the shoulder.  I then utilized three multi-tack sutures, and I inserted those in the proximal humerus.  Then the sutures were brought up through the rotator cuff and the rotator cuff was brought down to its anatomic position over the bleeding bone bed that we created.  I thoroughly irrigated out the shoulder and reapproximated the deltoid tendon and muscle in the usual fashion.  The subcutaneous was closed with #0 Vicryl.  The  skin was closed with metal staples.  I injected about 10 cc of 0.5% Marcaine and epinephrine into the wound site.  A sterile Neosporin dressing was applied.  I placed the patient in a shoulder immobilizer. DD:  02/23/00 TD:  02/24/00 Job: [id number]

## 2010-12-04 NOTE — Consult Note (Signed)
NAME:  Xavier White, Xavier White                      ACCOUNT NO.:  [id number]   MEDICAL RECORD NO.:  [id number]                   PATIENT TYPE:  REC   LOCATION:  TPC                                  FACILITY:  [facility]   PHYSICIAN:  Hans C. Stevphen Rochester, M.D.                DATE OF BIRTH:  10-15-1945   DATE OF CONSULTATION:  04/03/2002  DATE OF DISCHARGE:                                   CONSULTATION   REASON FOR CONSULTATION:  [facility] as a kind referral from Dr. Noel Gerold.  He is a pleasant 65-  year-old who comes in today complaining of back pain.  He is stating that he  has perilumbar discomfort with infragluteal component, and occasional left  leg pain.  Relating his pain as an 8/10 on a subjective scale, increasing to  10/10 with normal activities.  He is limited by normal activities, and  improved with rest and medications.  Described as achy, constant, throbbing,  sharp, weakness, and burning.  Tests include nerve conduction study, MRI,  and x-rays.  Made worse by bending and sitting.  He is currently not  working.  I review notes and imagings from Dr. Noel Gerold.  I review diagnostic  tests, including discogram.   MEDICATIONS:  1. OxyContin 20 mg, up to t.i.d.  2. Hydrocodone for breakthrough.  It sounds like he is taking up to four     hydrocodone a day.   ALLERGIES:  METHYLATE, __________.  Pharmacy declared his record, although  he was discharged from [facility].   REVIEW OF SYMPTOMS:  States no wish to harm self or others.  Fourteen point  review of systems and health and history form reviewed.   PAST SURGICAL HISTORY:  1. Unspecified shoulder surgery.  It sounds like rotator cuff repair.  2. Lumbar laminectomies with hardware.   PAST MEDICAL HISTORY:  Hypertension.   FAMILY HISTORY:  Family history of lung disease and cancer.   SOCIAL HISTORY:  He is a smoker with a 40 pack year history.  He does not  drink alcohol.  He is married.   He was an Personnel officer by trade.  He is not  working.  He does have an attorney helping him with pursuing a Workmen's  Compensation claim.  He has been out of work for a number of months.   Review of systems, family history, and social history otherwise  noncontributory.   PHYSICAL EXAMINATION:  GENERAL:  He is a pleasant anxious male sitting  comfortably in bed.  Gait, affect, and appearance is normal.  Oriented x3.  High level of pain behavior.  HEENT:  Unremarkable.  CHEST:  Clear to auscultation and percussion.  HEART:  He has a regular rate and rhythm without murmurs, rubs, or gallops.  ABDOMEN:  Soft, nontender, benign, no hepatosplenomegaly.  He is obese.  BACK:  He has diffuse paralumbar myofascial discomfort,  impaired flexion,  extension, lateral rotation with pain over PSIS and __________ and Patrick's  equivocal.  NEUROLOGIC:  Straight leg lift, Leseg's positive right greater than left and  distractable with Waddell's positive 3/5.  He has myofascial pain that is  distractable.  Pain over the vastus lateralis.  Heel-toe intact.  Romberg  intact.  No other overt neurological deficit in motor, sensory, or reflex.   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   PLAN:  1. I review the record.  Apparently, there has been either some     miscommunication or misunderstanding, as well as potential drug seeking     behavior.  I review this very frankly with the patient in a compassionate     care arena, nursing is present, no barriers to communication.  He     understands the patient care agreement, and we will only be treating     pain, we will not be treating narcotic habituation.  He also understands     that I will not necessarily continue to accept him as a patient.  I will     go ahead and enhance my database over the next week and bridge him with     Duragesic as a good faith effort to better understand his pain as it      relates to his current perceived disability.  2. Concerning is his Waddell's and symptom exaggeration on physical     examination.  As I get to know him I am going to go ahead and continue to     examine him, as I am sure he has some level of pain, but I am not really     clear on this at this point.  I am going to have to get also some records     from his primary care physician.  I went ahead and ordered those as well.     He agrees that we will be able to interrogate his pharmacy, and with full     informed consent I obtain MDSU.  I will also obtain LFT and GGT, and we     go through a prolonged explanation as to his medication usage, and he is     very anxious to explain the problems he has had with his perceived     seeking.  I am giving him the benefit of the doubt.,  3. Full disclosure and review of medications and potential risks of     medications, I am going to go ahead and give him Duragesic patch, three     of those which should bridge him through the week, he is to return his     patches to [place] in a baggie, and available for inspection.  No breakthrough     medication will be given.  I will also recommend cigarette cessation for     best outcome position, as well as home based therapy.  I think his pain     level does warrant pain medication at this time, but we will be     monitoring for medical necessity and need.  Possible interventional     procedures are also discussed.  Apparently, he is also being considered     for surgery, and this will be an issue with him.  This will be deferred     to Dr. Wells Guiles office.  He is probably a fair to poor surgical candidate  without clear insight into his pain and direct care in this regard,     cigarette cessation, lifestyle enhancements, weight control, and enabling     behavior, as he sees himself disabled at this time.  He is also working     through [employer] issues, and I defer to he and his attorney in     this  regard. 4. We will see him in followup.  Discharge instructions given.  There were     no barriers in communication.  Communication in this consultation was     well in excess of 25 minutes, and he was here for over a hour as we tried     to coordinate care and discuss movements forward in this regard.     Discharge instructions given.                                               Hans C. Stevphen Rochester, M.D.    [facility]  D:  04/03/2002  T:  04/03/2002  Job:  [id number]   cc:   Patricia Nettle, M.D.

## 2010-12-04 NOTE — Op Note (Signed)
   NAME:  Xavier White, Xavier White                      ACCOUNT NO.:  [id number]   MEDICAL RECORD NO.:  [id number]                   PATIENT TYPE:  OUT   LOCATION:  XRAY                                 FACILITY[facility]   PHYSICIAN:  Ricky D. Gasper Sells, M.D.             DATE OF BIRTH:  1946/05/10   DATE OF PROCEDURE:  07/04/2002  DATE OF DISCHARGE:                                 OPERATIVE REPORT   PROCEDURE:  Right sacroiliac joint block under fluoroscopic control.   SURGEON:  Ricky D. Gasper Sells, M.D.   COMPLICATIONS:  Nil.   DESCRIPTION OF PROCEDURE:  With the patient in the prone position, under  fluoroscopic control, the right SI joint was visualized.  A 22-gauge needle  was inserted down to the junction of the SI joint with the pelvis.  Poking  the joint re-created his discomfort on the right side.  The joint was then  blocked with 3 cc total of a solution of 2 cc of 0.5% bupivacaine and 1 cc  of Depo-Medrol and 80 mg of methylprednisolone; this completely relieved his  right-sided discomfort.  No complications occurred.                                               Ricky D. Gasper Sells, M.D.    RDH/MEDQ  D:  07/04/2002  T:  07/04/2002  Job:  [id number]

## 2010-12-04 NOTE — Consult Note (Signed)
NAME:  Xavier White, Xavier White                      ACCOUNT NO.:  [id number]   MEDICAL RECORD NO.:  [id number]                   PATIENT TYPE:  INP   LOCATION:  3029                                 FACILITY:  [facility]   PHYSICIAN:  Larina Earthly, M.D.                 DATE OF BIRTH:  1946/03/17   DATE OF CONSULTATION:  08/22/2003  DATE OF DISCHARGE:                                   CONSULTATION   REFERRING PHYSICIAN:  Dr. Donnal Debar. Gasper Sells.   HISTORY OF PRESENT ILLNESS:  Xavier White is a 65 year old white male who  underwent L4-5 lateral recess decompression, laminectomy and diskectomy by  Dr. Gasper Sells on August 13, 2003.  The procedure was difficult and  complicated by a metal fragment breaking off the tip of a pituitary  instrument and could not be retrieved.  The patient was discharged home  after surgery and over the past week, has developed increasing pain in his  back and also leg pain.  He had a fever to 101 at home.  His wife also had a  febrile incident at the same time.  He was brought back to the hospital and  underwent a CAT scan.  This revealed a left retroperitoneal hematoma, some  fluid around the site of the diskectomy and some small amount of air at this  site as well.  It also showed the metal fragment between the level of the  disk and just distal to the aortic bifurcation, behind the level of the left  common iliac artery.  I was asked to see for evaluation of the hematoma and  the fragment in proximity of the great vessels.   PHYSICAL EXAMINATION:  GENERAL:  On physical exam, the patient is a well-  developed, well-nourished white male in no acute distress.  He does have  chronic back pain and is having continued back pain currently.  He reports  that he has some pain and numbness in his lower extremities bilaterally;  this is chronic.  ABDOMEN:  His abdominal exam reveals mild tenderness in the left lower  quadrant and reports this may be secondary to the aspiration  attempts  yesterday.  EXTREMITIES:  He has 2+ femoral pulses, has a 2+ left dorsalis pedis and 2+  right posterior tibial pulse.  VITAL SIGNS:  His temperature is 98.5.   ASSESSMENT AND RECOMMENDATIONS:  He did undergo aspiration of the disk space  in interventional radiology yesterday and this revealed no bacteria on  Gram's stain; cultures are pending.   I had a long discussion with Xavier White.  I explained that I feel that  this is, in all likelihood, a simple retroperitoneal hematoma following his  disk surgery.  He has a small amount of air at the site of the surgery  itself but not in the retroperitoneum itself.  I suspect this is related to  air introduced at the time of surgery during  the normal course of the  operation.  I would agree with antibiotic treatment empirically.  I do not  see any contraindications to anticoagulation if clinically indicated and I  feel that the retroperitoneal hematoma is stable, certainly, no indication  for exploration at this time.  I  also feel that he has a low risk regarding  the retained fragment.  By a CAT scan, this is lying on top of the disk  between the level of the disk and the left common iliac artery, just past  the aortic bifurcation.  I explained to the patient that it is not  impossible that this would migrate, but certainly the location that it is  currently would not be worrisome.  I explained that it would require major  laparotomy and mobilization of the aorta and vena cava for retrieval for  this and certainly would not recommend it unless there was evidence of  infection in this area or migration of the fragment.  I would recommend  repeat CAT scan in  approximately 3 months to assure that there is no movement of this.  Xavier White was reassured with this discussion and plans will continue per Dr.  Gasper Sells and infectious disease.   Thank you for this consult.                                               Larina Earthly, M.D.    TFE/MEDQ  D:  08/22/2003  T:  08/22/2003  Job:  [id number]   cc:   Clide Cliff D. Gasper Sells, M.D.  [address]  [place]  [place] [address]  Fax: [phone]   Cliffton Asters, M.D.  [address] [place]  [place] [address]  Fax: [phone]

## 2010-12-04 NOTE — Discharge Summary (Signed)
NAME:  ZARIUS, FURR                      ACCOUNT NO.:  [id number]   MEDICAL RECORD NO.:  [id number]                   PATIENT TYPE:  OIB   LOCATION:  3018                                 FACILITY:  [facility]   PHYSICIAN:  Ricky D. Gasper Sells, M.D.             DATE OF BIRTH:  02-Dec-1945   DATE OF ADMISSION:  05/14/2002  DATE OF DISCHARGE:  05/14/2002                                 DISCHARGE SUMMARY   HISTORY OF PRESENT ILLNESS:  This 65 year old gentleman had had a bilateral  L5-S1 diskectomy done by Dr. Georges Lynch. Gioffre last year.  The patient  claimed that he never got better.  He was then seen by a new back associate  who wanted to do a fusion on him, he said, but never got around to it.  He  ended up seeing me on April 25, 2002.  He was quite irritable at that time.  He had quite a bit of right leg pain, right foot and toes, as well as leg  numbness.  A positive straight leg raising on the right.  He had been on  OxyContin 20 mg p.o. b.i.d.  He had trials of oral steroids in the past.  He  also had an epidural steroid.  When I saw him in the office on April 25, 2002, the dorsiflexion was intact.  His plantar flexion on the right was  weak.  Preoperatively, however, his dorsiflexion was somewhat weak and his  plantar flexion was markedly more weak, so he was getting worse.  In his  right ankle the jerk was absent.  His left was present.  Knee jerks were  present.  Again his exam was somewhat different in the office than it was  here, suggesting some sequestration with a positive left crossed straight  leg raising and a positive right straight leg raising, with right and left  leg pain.  The Naffziger's sign was positive.  I decided to do a myelogram  on him, which confirmed disease at the L4-5 and L5-S1 levels, eccentric to  the right disk bulges or ruptures.  Discograms were compatible with that  study.  It was decided to go ahead and simply decompress him and would  evaluate  his stability at the time of surgery.   HOSPITAL COURSE:  Intraoperatively the L5-S1 and L4-5 levels were relatively  stable.  There was definitely a rupture on the right at L4-5.  Bilateral  laminectomies were carried out at both levels.  A radical discoidectomy was  carried out from the right at L4-5.  Disk material as well as residual from  epidural steroids was removed at the L5-S1 level bilaterally.  The nerve  roots of L5 and S1 were free at the time of closure.  The patient awoke from surgery without his usual leg pain.  The numbness in  his right foot improved immediately.  He was up, walking, voiding, and  drinking  oral fluids within three hours postoperatively.  I came to see him.  His temperature was normal.  His vital signs were stable.  His wound had to  be reinforced because of the antibiotics I had left in the subfascial space.  This was leaking out.  Nevertheless, it was decided to send him home.   DISPOSITION:  He was discharged home in the care of his family.  To be seen  by me in my office in one week's time.  He will be followed up by the  telephone and in eight days' time in my office for staple removal.   CONDITION ON DISCHARGE:  Markedly improved.                                                Ricky D. Gasper Sells, M.D.    RDH/MEDQ  D:  05/14/2002  T:  05/15/2002  Job:  [id number]   cc:   Juanito Doom, M.D.  [place], [place]

## 2010-12-04 NOTE — Op Note (Signed)
   NAME:  Xavier White, Xavier White                      ACCOUNT NO.:  [id number]   MEDICAL RECORD NO.:  [id number]                   PATIENT TYPE:  OUT   LOCATION:  XRAY                                 FACILITY:  [facility]   PHYSICIAN:  Ricky D. Gasper Sells, M.D.             DATE OF BIRTH:  [birth year]   DATE OF PROCEDURE:  07/04/2002  DATE OF DISCHARGE:                                 OPERATIVE REPORT   PROCEDURE:  Right SI joint block.   SURGEON:  Ricky D. Gasper Sells, M.D.   COMPLICATIONS:  Nil.   DESCRIPTION OF PROCEDURE:  With the patient in the prone position with the  patient's buttocks prepped with Betadine solution, the patient's left SI  joint was visualized and a 22 gauge was inserted down to the junction of  that joint with the pelvic cavity.  Poking the joint recreated his pain.  Solution of 3 cc total with 2 cc of 0.25% bupivacaine and 1 cc of Depo-  Medrol was then injected, and 0.5 cc of Al loquats of aspiration before each  injection.  This completed relieved his left-sided discomfort.                                               Ricky D. Gasper Sells, M.D.    RDH/MEDQ  D:  07/04/2002  T:  07/04/2002  Job:  [id number]

## 2010-12-04 NOTE — Op Note (Signed)
NAME:  Xavier White, Xavier White                      ACCOUNT NO.:  [id number]   MEDICAL RECORD NO.:  [id number]                   PATIENT TYPE:  OIB   LOCATION:  6729                                 FACILITY:  [facility]   PHYSICIAN:  Ricky D. Gasper Sells, M.D.             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   OPERATION:  Bilateral microsurgical L4-5 lateral recess decompression and  laminectomy with discectomy--more difficult than average.   SURGEON:  Ricky D. Gasper Sells, M.D.   ASSISTANT:  Izell [place]. [place], M.D.   ESTIMATED BLOOD LOSS:  Less than 100 cc   COMPLICATIONS:  A small piece of a small pituitary, the upper part, broke  off in the L4-5 interspace. A prolonged and extensive effort was made to  remove this fragment of metal.  The instrument was probably defective and  another instrument was broken in the efforts to remove it. Efforts included  biplaner triangulation for localization followed by efforts to curette,  nerve hook, and using the pituitaries to remove the fragment to no avail,  and finally extremely powerful magnet used to try to move the fragment  posteriorly; this failed.  This is the magnet that is used for intraocular  removal of metal.  The last location was near the midline right along the  anterior longitudinal ligament.  It was felt that pursuing this small piece  of metal which is not particularly sharp or pointed, in this location,  represented a much greater risk to the patient than simply leaving it and  following it.  If necessary, it can be removed through a limited  extraperitoneal approach from the front if, for some  reason, it becomes  necessary.  Probably since it is stuck in soft tissue it will simply wall  itself off.   DRAINS:  None.   SUMMARY:  This is a 65 year old gentleman who underwent a remote lumbar  laminectomy.  He has presented numerous times with SI joint pain for which I  have injected him successfully.  At this time he had a positive straight leg  raise and features that were suggestive of spinal stenosis, mainly postural  with anterior flexion; and a positive straight leg raise with a clear cut  left L5 radiculopathy.  A block of his SI joint was done a week prior to  surgery and this gave him incomplete relief.  A myelogram CT scan showed  very tight lateral recesses bilaterally, left more so than right.  He  underwent a very successful, minimally invasive decompression of the left  lateral recess, but the pituitary instrument broke off, but a somewhat more  invasive lateral retractor system which we were looking at from the second  floor of the hospital was used and an attempt was made to get this fragment  from the left.  This attempt failed as described.  Decompression was carried  out from the right side using the same retractor system rather than the  [employer]ore[employer].  If the soft tissues were retracted with anything more than with  the conventional retraction systems, probably leading to more postoperative  pain in his case. In any case, he awoke from surgery without his usual leg  pain.   He had some residual hip pain on the left side.  This is confusing because  he had recent SI joint injections. He was able to ambulate, he voided  normal.  His wound remained clear.  His vital signs are stable. He had no  headache when he awoke.   PROCEDURE:  With the patient in the prone position the patient's back was  prepped and draped in the usual fashion for a lumbar laminectomy under  fluoroscopic control.  The fluoroscope was used for a total of about 3 hours  in this case.   A  22-gauge needle was inserted down to bone lateral to the midline, first on  the left, then the right; 0.5 cc of methylene blue was injected to mark that  level visually; and then on each side, a total of 10 cc of 0.5L% bupivacaine  with epinephrine was injected as the 22-gauge needle was withdrawn.  All of  this was under fluoroscopic control.   Starting out on the left side, having repeated the skin prep with Betadine,  and then draped the patient with the C-arm in place and carefully inspecting  positioning and padding all pressure points, using the Metore[employer] system, an 18-  mm retraction tube was inserted down to the L4-5 facet joint dorsally.  A  hole was drilled down through the facet removing the superior facet of the  inferior vertebral body, in other words, opening of the lateral recess.  Then the disk was entered and evacuated with straight biting pituitary  rongeurs.  Fortunately towards the end of this evacuation the upper end of a  small pituitary rongeur broke off.  Another rongeur was handed to me and the  tip was bent in the same area that was broken; these are both fenestrated  pituitaries; so that was handed off the table as a defective instrument.  Then another pituitary was brought in and this broke when I was fishing for  the other broken fragment.  Fortunately that piece was easy to remove and  easier still to find.  The fragment in question remained embedded in soft  tissue.  An attempt was made to curette it out.  It seemed to be migrating  anteriorly.  Before it migrated into the prevertebral space or  retroperitoneum in close approximation to the iliac vessels, but probably  too inferior for the ureter and too medial; in any case it was decided to  back off and do the right side.  Gelfoam was used to control hemostasis on  that side.   On the right side exactly the same procedure was carried out with the larger 3-pronged lateral retractor system.   The lateral  recess was clearly decompressed.  The disk was evacuated.  The  L4-5 medial aspect of the facet was partially, but not completely removed.  The nerve root was cleared.  In order to shore up some rather fragile  looking dura, a Tisseel duraplasty was performed.  This was not for a leak.  The dura did not leak to a Valsalva of 30+ cm of water.  But any way, it is  a preventative measure and sort of a sandwich of this stuff with Surgicel.  A total of 100 mcg of Fentanyl was left in the epidural space.   Once that had been completed, and consequently checked with the C-arm, the  wound was closed; closing the lumbar fascia laterally with 3 interrupted 2-0  Vicryl sutures. Subcutaneous closure was closed with 4-0 Vicryl inverted  interrupted. Skin staples were used in the skin.  A bacitracin ointment and  dry dressing was applied.  The patient tolerated the procedure well and  awoke from surgery unremarkably.                                               Ricky D. Gasper Sells, M.D.    RDH/MEDQ  D:  08/13/2003  T:  08/13/2003  Job:  [id number]

## 2010-12-04 NOTE — Op Note (Signed)
NAME:  DOMINYK, LAW                      ACCOUNT NO.:  [id number]   MEDICAL RECORD NO.:  [id number]                   PATIENT TYPE:  OIB   LOCATION:  3018                                 FACILITY:  [facility]   PHYSICIAN:  Ricky D. Gasper Sells, M.D.             DATE OF BIRTH:  06-Aug-1945   DATE OF PROCEDURE:  05/14/2002  DATE OF DISCHARGE:                                 OPERATIVE REPORT   PREOPERATIVE DIAGNOSES:  1. Recurrent disk with spinal stenosis at L5-S1, right greater than left.  2. Spinal stenosis L4-5 with herniated nucleus pulposus eccentric to the     right.  3. Circumferential disk herniation, positive discograms at both levels.   POSTOPERATIVE DIAGNOSES:  1. Recurrent disk with spinal stenosis at L5-S1, right greater than left.  2. Spinal stenosis L4-5 with herniated nucleus pulposus eccentric to the     right.  3. Circumferential disk herniation, positive discograms at both levels.   OPERATION:  1. Bilateral L4-5 and L5-S1 laminectomies.  2. Intradiskal injection of 4 cc of vancomycin solution (discogram).  3. Right-sided radical L4-5 diskectomy.  4. Both these levels were done in a micro fashion.   SURGEON:  Ricky D. Gasper Sells, M.D.   ASSISTANT:  [place].   ANESTHESIA:  General, controlled.   COUNTS:  All sponge, instrument and needle counts were correct.   ESTIMATED BLOOD LOSS:  Less than  200 cc.   INDICATIONS:  This  is a 65 year old  gentleman who underwent a bilateral L5-  S1 diskectomy  by a local orthopedic surgeon about 10 months ago. This  surgery failed. He was then referred to the surgeon's partner, who was a  spinal specialist and for some reason, the patient was lost to care.  Basically  he was  told he needed a fusion. I evaluated him in my office and  evaluated his MRIs plus did a discogram on him plus did a myelogram CT scan.  The flexion extension views revealed no abnormal motion. He clearly had a  recurrent disk at L5-S1, eccentric to  the right with spinal stenosis and  lateral recess stenosis.   At the L4-5 level there was a circumferential disk herniation, somewhat  eccentric to the right into the stenotic canal. At the L3-4 level on the  discogram, the discogram was relatively normal with anterior extravasation  of some material contrast media, but without replication of the patient's  usual clinical symptoms, only back pain, and of course this could be  referred. It was then decided to do a bilateral L4-5 and L5-S1 laminectomy  and a diskectomy at both levels.   At the L4-5 level the disk will be evaluated with an intraoperative  intradiskal injection or discogram. This was performed without difficulty.  Vancomycin solution was the fluid of choice. The disk accepted 4 cc of fluid  with no end point in site. Piercing the disk did not reveal any recoverable  fluid,  therefore the disk was severely degenerated, and that is the reason  it was removed.   Preoperatively he had a mixed L5 and S1 radiculopathy with dorsiflexion  weakness, plantar flexion weakness and numbness and tingling  involving the  dorsal aspect of the foot, as well as the lateral aspect of the foot and  calf and tenderness on the right side. He had a decreased ankle jerk on the  right as compared to the left as well.   Intraoperatively no complications occurred. The L5 and S1 nerve roots were  nicely decompressed bilaterally. The most severe compression was at right L4-  5, involving the right L5 nerve root with the tightest lateral recess at  that level. The intraoperative intradiskal injection was abnormal at the L4-  5 level.   DESCRIPTION OF PROCEDURE:  With the patient in the prone position with the  patient's hair on his back clipped  and prepped with Betadine solution, then  with Duraprep prep, the patient was  draped in the usual fashion for a  lumbar laminectomy with PS hose in place, preoperatively and with the Bair  Hugger blanket in  place as well.   The previous midline incision was excised and extended about 1 cm  superiorly. Subcutaneous hemostasis was achieved with unipolar coagulation.  The rest of the lumbar fascia was then incised along  the  spinous processes  of L4-L5 and S1. The levels were confirmed with intraoperative control x-  rays x 3. The paraspinal muscles were then dissected free of the spinous  process of the lamina of L4-L5 and S1 bilaterally and the self-retaining  retractors were applied.   Adequate inferior L4 laminotomy and medial facetectomy was performed at the  L4-5 level bilaterally using  a high speed drill and a Kerrison punch and  leaving the ligamentum flavum intact. The same technique was carried out at  L5-S1, lysing epidural adhesions, exposing the S1 nerve roots bilaterally.   The right side was carefully explored, and external neurolysis was carried  out along the S1 nerve root on that side and free disk material was found in  the epidural space of a minimal amount. As well as the residual of the  epidural steroids. On the left side the S1 foramen was patent. The nerve  root was not aggressively explored because there was very little left leg  pain.   At the L4-5 level, starting out on the right side, the ligamentum flavum was  removed and the underlying dura and L5 nerve root was retracted medially to  reveal the L4-5 disk. A 22 gauge needle connected to a 5 cc syringe with  vancomycin was then inserted into the disk and slowly injected. There was no  endpoint in site at 4 cc and basically no back pressure and no pressure, so  the injection was stopped. This was a grossly abnormal exam. There was no  obvious extravasation.   At that point the anulus was incised in a trapezoidal fashion with a #15  blade knife and no fluid returned, suggesting a grossly abnormal disk, probably with annular tears anteriorly, although we do not know because we  did not see the fluid  extravasate. In any case the disk was removed with  straight-biting, up-biting, down-biting pituitary rongeurs, curetted with an  Epstein instrument subligamentously and then re-evacuated following scraping  with the medium bent ring curet.  Epidural hemostasis was achieved using  bipolar coagulation and Gelfoam was used to consolidate that.  On the left side the epidural space was evaluated. The disk was  flat. It  was decided not to open the annulus on that side. As much of the ligamentum  flavum as possible was left intact.   The wound was irrigated numerous times with Betadine solution and hydrogen  peroxide. Self-retaining retractors were removed and the dorsolumbar fascia  was closed with 2-0 Vicryl in an interrupted fashion, injecting 10 cc of  vancomycin solution into the subfascial plane. This is expected to leak out  postoperatively.   The subcutaneous closure was carried out with three horizontal mattress  sutures. A total of  10 cc of bupivacaine was then injected subcutaneously  and the skin was closed with interrupted staples, leaving one staple out at  the end for  the injection of subcutaneous 4 cc of remaining vancomycin  solution and one more staple was applied.  A dry dressing was applied.   The patient tolerated the procedure well and awoke from surgery  unremarkably. He was transferred then to the recovery room.                                                  Ricky D. Gasper Sells, M.D.    RDH/MEDQ  D:  05/14/2002  T:  05/14/2002  Job:  [id number]

## 2010-12-04 NOTE — Op Note (Signed)
NAME:  Xavier White, Xavier White                      ACCOUNT NO.:  [id number]   MEDICAL RECORD NO.:  [id number]                   PATIENT TYPE:  OUT   LOCATION:  XRAY                                 FACILITY:  [facility]   PHYSICIAN:  Ricky D. Gasper Sells, M.D.             DATE OF BIRTH:  Sep 28, 1945   DATE OF PROCEDURE:  08/08/2003  DATE OF DISCHARGE:                                 OPERATIVE REPORT   PROCEDURE:  Right sacroiliac joint block under fluoroscopic control.   SURGEON:  Ricky D. Gasper Sells, M.D.   COMPLICATIONS:  Nil.   INDICATIONS:  This is a gentleman with mixed degenerative disk disease,  spinal stenosis and sacroiliac joint pain.  The pain was worse on the left  actually.   DESCRIPTION OF PROCEDURE:  The left SI joint was blocked first.  The joint  was visualized under fluoroscopic control and a 22 gauge needle was inserted  down to the junction of the right SI joint with the pelvis.  Poking in this  joint recreated his right-sided discomfort.  The joint was then blocked with  0.2 cc of 0.5% bupivacaine and 1 cc of Depo-Medrol.  The patient tolerated  the procedure fine and was significantly better at the end of the procedure.                                               Ricky D. Gasper Sells, M.D.    RDH/MEDQ  D:  08/08/2003  T:  08/08/2003  Job:  [id number]

## 2010-12-04 NOTE — Op Note (Signed)
   NAME:  Xavier White, Xavier White                      ACCOUNT NO.:  [id number]   MEDICAL RECORD NO.:  [id number]                   PATIENT TYPE:  OUT   LOCATION:  XRAY                                 FACILITY:  [facility]   PHYSICIAN:  Ricky D. Gasper Sells, M.D.             DATE OF BIRTH:  October 31, 1945   DATE OF PROCEDURE:  09/20/2002  DATE OF DISCHARGE:                                 OPERATIVE REPORT   PROCEDURE:  Right SI joint block under fluoroscopic control using a 22-gauge  needle.   SURGEON:  Ricky D. Gasper Sells, M.D.   ASSISTANT:  Nil.   COMPLICATIONS:  Nil.   DESCRIPTION OF PROCEDURE:  Under fluoroscopic control with the buttocks  prepped with Betadine and positioned in the prone position a 22-gauge needle  was inserted down to the junction of the right SI joint with the pelvis.  Poking that joint recreated his right-sided discomfort.  The joint was then  blocked with a total of 2 mL of 0.5% bupivacaine and 1 mL of Depo-Medrol, 80  mg of methylprednisolone.  This significantly decreased his right-sided  discomfort.                                               Ricky D. Gasper Sells, M.D.    RDH/MEDQ  D:  09/20/2002  T:  09/20/2002  Job:  [id number]

## 2010-12-04 NOTE — Op Note (Signed)
NAME:  TOME, WILSON                      ACCOUNT NO.:  [id number]   MEDICAL RECORD NO.:  [id number]                   PATIENT TYPE:  OUT   LOCATION:  XRAY                                 FACILITY:  [facility]   PHYSICIAN:  Ricky D. Gasper Sells, M.D.             DATE OF BIRTH:  02/04/1946   DATE OF PROCEDURE:  08/07/2003  DATE OF DISCHARGE:                                 OPERATIVE REPORT   PROCEDURE:  Left sacroiliac joint block under fluoroscopic control.   SURGEON:  Ricky D. Gasper Sells, M.D.   SUMMARY:  This is a 65 year old gentleman with mixed SI joint pain and  degenerative disk disease with spinal stenosis as well.  It was decided to  block his left SI joint and his right SI joint actually, which has already  been done before doing his myelogram.   DESCRIPTION OF PROCEDURE:  A 22-gauge needle was inserted down to the  junction of the left SI joint with the pelvis in the AP projection.  Poking  this joint recreated his discomfort.   A control x-ray was done on both SI joints with the needles in place.   A total of 2 ml of 0.5% bupivacaine was injected in the region of the left  SI joint in the previously described location; 0.5 ml aliquot aspiration  before each injection.  At the end of the injection, he has significant  relief in his left-sided discomfort from his SI joint.                                               Ricky D. Gasper Sells, M.D.    RDH/MEDQ  D:  08/08/2003  T:  08/08/2003  Job:  [id number]

## 2010-12-04 NOTE — Op Note (Signed)
   NAME:  Xavier White, Xavier White                      ACCOUNT NO.:  [id number]   MEDICAL RECORD NO.:  [id number]                   PATIENT TYPE:  OUT   LOCATION:  XRAY                                 FACILITY:  [facility]   PHYSICIAN:  Ricky D. Gasper Sells, M.D.             DATE OF BIRTH:  1945-10-04   DATE OF PROCEDURE:  04/05/2003  DATE OF DISCHARGE:                                 OPERATIVE REPORT   PROCEDURE:  Left sacroiliac joint block under fluoroscopic guidance.   SURGEON:  Ricky D. Gasper Sells, M.D.   COMPLICATIONS:  None.   DESCRIPTION OF PROCEDURE:  With the patient in the prone position the  patient's buttocks were prepped with Betadine solution and a 22 guage needle  was inserted down to the junction  of the SI joint with the pelvis in the AP  projection. Poking this joint recreated his left-sided discomfort.   The joint  was then blocked  with 3 mL of a solution of 1 mL of Depo-Medrol,  80 mg of methylprednisolone and 2 mL of 0.5% bupivacaine without  epinephrine. This was done in 1 mL aliquots of aspiration for each  injection. This blocked the patient's left-sided discomfort  almost  completely. The patient tolerated the procedure well.                                               Ricky D. Gasper Sells, M.D.    RDH/MEDQ  D:  04/05/2003  T:  04/05/2003  Job:  [id number]

## 2010-12-04 NOTE — Op Note (Signed)
   NAME:  Xavier White, Xavier White                      ACCOUNT NO.:  [id number]   MEDICAL RECORD NO.:  [id number]                   PATIENT TYPE:  OUT   LOCATION:  XRAY                                 FACILITY:  [facility]   PHYSICIAN:  Ricky D. Gasper Sells, M.D.             DATE OF BIRTH:  [birth year]   DATE OF PROCEDURE:  DATE OF DISCHARGE:                                 OPERATIVE REPORT   PROCEDURE:  Left sacroiliac joint injection, under fluoroscopic control.   SURGEON:  Ricky D. Gasper Sells, M.D.   ASSISTANT:  None.   COMPLICATIONS:  None.   SOLUTION:  2 cc of 25% bupivacaine and 1 cc Depo-Medrol, 80 mg  methylprednisolone.   DESCRIPTION OF PROCEDURE:  With the patient in the prone position with the  patient's buttocks prepped with Betadine solution, a  22-gauge needle was inserted down to the junction of the left SI joint with  the pelvis.  Then three 3 cc of previously described solution was then  injected in 0.5 cc aliquots of aspiration for each injection.  This gave him  good relief of his pain.                                               Ricky D. Gasper Sells, M.D.    RDH/MEDQ  D:  07/11/2002  T:  07/11/2002  Job:  [id number]

## 2010-12-04 NOTE — Op Note (Signed)
NAME:  VIAN, FLUEGEL                      ACCOUNT NO.:  [id number]   MEDICAL RECORD NO.:  [id number]                   PATIENT TYPE:  OUT   LOCATION:  XRAY                                 FACILITY:  [facility]   PHYSICIAN:  Ricky D. Gasper Sells, M.D.             DATE OF BIRTH:  04-29-1946   DATE OF PROCEDURE:  09/19/2003  DATE OF DISCHARGE:                                 OPERATIVE REPORT   This is a left S1 joint block under fluoroscopic control.   SURGEON:  Dr. Gasper Sells.   PROCEDURE:  With the patient in the prone position, the patient was prepped  with a Betadine solution, and a 22-gauge needle was inserted down the  junction with the pelvis. Pointing this joint recreated an exquisite amount  of discomfort in that area. The joint was then blocked with 0.5% bupivacaine  and 1 cc of diabetes mellitus. Hard films were kept of the needles of both  joints. This significantly decreased his left sided S1 joint pain and the  symptoms in his left leg, mainly numbness disappeared.   Following blocking of both these joints, he had significant reduction in leg  pain and numbness and tingling in the legs and pain in his S1 joints, but he  still had some back pain. This indicates that his back pain/leg pain is  related to more than one cause, some of it being related to S1 joints.                                               Ricky D. Gasper Sells, M.D.    RDH/MEDQ  D:  09/19/2003  T:  09/19/2003  Job:  [id number]

## 2010-12-04 NOTE — Op Note (Signed)
   NAME:  Xavier White, Xavier White                      ACCOUNT NO.:  [id number]   MEDICAL RECORD NO.:  [id number]                   PATIENT TYPE:  OUT   LOCATION:  XRAY                                 FACILITY:  [facility]   PHYSICIAN:  Ricky D. Gasper Sells, M.D.             DATE OF BIRTH:  10-21-1945   DATE OF PROCEDURE:  07/11/2002  DATE OF DISCHARGE:                                 OPERATIVE REPORT   PROCEDURE PERFORMED:  Right sacroiliac joint block.   SURGEON:  Ricky D. Gasper Sells, M.D.   Fluoroscopic control included, solution 2 cc of 0.5% bupivacaine and 1 cc of  Depo-Medrol mixed 80 mg of methylprednisolone.   COMPLICATIONS:  Nil.   DESCRIPTION OF PROCEDURE:  With the patient in the prone position, the  patient's SI joints were visualized under fluoroscopic control and the  buttocks were prepped with Betadine solution.  A 22 gauge needle was  inserted down to the junction of the SI joint with the pelvis.  Poking joint  recreated his discomfort.  The joint was then injected with a total of 3 cc  of the previously described solution in 0.5 cc aliquots with aspiration  before each injection.  The needle was then removed.  The patient tolerated  the procedure well and left with good relief of his discomfort.                                               Ricky D. Gasper Sells, M.D.    RDH/MEDQ  D:  07/11/2002  T:  07/11/2002  Job:  [id number]
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
## 2010-12-04 NOTE — Op Note (Signed)
NAME:  Xavier White, Xavier White                      ACCOUNT NO.:  [id number]   MEDICAL RECORD NO.:  [id number]                   PATIENT TYPE:  OUT   LOCATION:  XRAY                                 FACILITY:  [facility]   PHYSICIAN:  Ricky D. Gasper Sells, M.D.             DATE OF BIRTH:  [birth year]   DATE OF PROCEDURE:  09/25/2003  DATE OF DISCHARGE:                                 OPERATIVE REPORT   PROCEDURE:  Right SI joint block under fluoroscopic control.   PROCEDURALIST:  Ricky D. Gasper Sells, M.D.   COMPLICATIONS:  Nil.   SUMMARY:  This is a 65 year old gentleman with known bilateral SI joint pain  right greater than left.   DESCRIPTION OF PROCEDURE:  With the patient's buttocks prepped with Betadine  solution a 22-guage needle was inserted down to the junction of the SI  joints with the pelvis.  Poking these joints recreated the patient's  discomfort on the right side.  The joint was then blocked with 3 mL of 0.5%  bupivacaine, 2 mL and 1 mL Depo-Medrol, 80 mg of methylprednisolone.  This  significantly decreased the patient's right-sided discomfort, localizing the  pain to that area.Cliffton Asters, M.D.                                               Ricky D. Gasper Sells, M.D.    RDH/MEDQ  D:  09/25/2003  T:  09/25/2003  Job:  [id number]   cc:   Cliffton Asters, M.D.  [address] [place]  [place] [address]  Fax: [phone]

## 2011-04-23 LAB — COMPREHENSIVE METABOLIC PANEL
ALT: 47 U/L (ref 0–53)
BUN: 8 mg/dL (ref 6–23)
CO2: 26 mEq/L (ref 19–32)
Calcium: 9.2 mg/dL (ref 8.4–10.5)
Chloride: 102 mEq/L (ref 96–112)
GFR calc Af Amer: >60 mL/min (ref >60)
GFR calc non Af Amer: >60 mL/min (ref >60)
Glucose, Bld: 196 mg/dL — ABNORMAL HIGH (ref 70–99)
Sodium: 136 mEq/L (ref 135–145)
Total Bilirubin: 0.6 mg/dL (ref 0.3–1.2)

## 2011-04-23 LAB — URINALYSIS, ROUTINE W REFLEX MICROSCOPIC
Hgb urine dipstick: NEGATIVE
Ketones, ur: NEGATIVE mg/dL
Nitrite: NEGATIVE
Protein, ur: NEGATIVE mg/dL

## 2011-04-23 LAB — CBC
MCV: 93.5 fL (ref 78.0–100.0)
Platelets: 144 10*3/uL — ABNORMAL LOW (ref 150–400)
RDW: 12.9 % (ref 11.5–15.5)
WBC: 5.7 10*3/uL (ref 4.0–10.5)

## 2011-04-23 LAB — DIFFERENTIAL
Basophils Relative: 0 % (ref 0–1)
Lymphs Abs: 1.5 10*3/uL (ref 0.7–4.0)
Neutrophils Relative %: 64 % (ref 43–77)

## 2011-04-23 LAB — LIPASE, BLOOD: Lipase: 19 U/L (ref 11–59)

## 2011-11-29 ENCOUNTER — Emergency Department (HOSPITAL_COMMUNITY): Payer: Medicare Other

## 2011-11-29 ENCOUNTER — Encounter (HOSPITAL_COMMUNITY): Payer: Self-pay | Admitting: *Deleted

## 2011-11-29 ENCOUNTER — Inpatient Hospital Stay (HOSPITAL_COMMUNITY): Payer: Medicare Other

## 2011-11-29 ENCOUNTER — Inpatient Hospital Stay (HOSPITAL_COMMUNITY)
Admission: EM | Admit: 2011-11-29 | Discharge: 2011-12-10 | DRG: 870 | Discharge status: Against Medical Advice | Payer: Medicare Other | Attending: Internal Medicine | Admitting: Internal Medicine

## 2011-11-29 DIAGNOSIS — N390 Urinary tract infection, site not specified: Secondary | ICD-10-CM

## 2011-11-29 DIAGNOSIS — D6489 Other specified anemias: Secondary | ICD-10-CM | POA: Diagnosis present

## 2011-11-29 DIAGNOSIS — G9349 Other encephalopathy: Secondary | ICD-10-CM | POA: Diagnosis not present

## 2011-11-29 DIAGNOSIS — I959 Hypotension, unspecified: Secondary | ICD-10-CM | POA: Diagnosis present

## 2011-11-29 DIAGNOSIS — J9690 Respiratory failure, unspecified, unspecified whether with hypoxia or hypercapnia: Secondary | ICD-10-CM

## 2011-11-29 DIAGNOSIS — N179 Acute kidney failure, unspecified: Secondary | ICD-10-CM | POA: Diagnosis present

## 2011-11-29 DIAGNOSIS — G8929 Other chronic pain: Secondary | ICD-10-CM

## 2011-11-29 DIAGNOSIS — W11XXXA Fall on and from ladder, initial encounter: Secondary | ICD-10-CM | POA: Diagnosis present

## 2011-11-29 DIAGNOSIS — M549 Dorsalgia, unspecified: Secondary | ICD-10-CM

## 2011-11-29 DIAGNOSIS — E1142 Type 2 diabetes mellitus with diabetic polyneuropathy: Secondary | ICD-10-CM | POA: Diagnosis present

## 2011-11-29 DIAGNOSIS — R509 Fever, unspecified: Secondary | ICD-10-CM

## 2011-11-29 DIAGNOSIS — E876 Hypokalemia: Secondary | ICD-10-CM

## 2011-11-29 DIAGNOSIS — Z781 Physical restraint status: Secondary | ICD-10-CM | POA: Diagnosis not present

## 2011-11-29 DIAGNOSIS — F19939 Other psychoactive substance use, unspecified with withdrawal, unspecified: Secondary | ICD-10-CM | POA: Diagnosis not present

## 2011-11-29 DIAGNOSIS — I10 Essential (primary) hypertension: Secondary | ICD-10-CM | POA: Diagnosis present

## 2011-11-29 DIAGNOSIS — J811 Chronic pulmonary edema: Secondary | ICD-10-CM

## 2011-11-29 DIAGNOSIS — E1149 Type 2 diabetes mellitus with other diabetic neurological complication: Secondary | ICD-10-CM | POA: Diagnosis present

## 2011-11-29 DIAGNOSIS — B1920 Unspecified viral hepatitis C without hepatic coma: Secondary | ICD-10-CM | POA: Diagnosis present

## 2011-11-29 DIAGNOSIS — N19 Unspecified kidney failure: Secondary | ICD-10-CM

## 2011-11-29 DIAGNOSIS — F1120 Opioid dependence, uncomplicated: Secondary | ICD-10-CM

## 2011-11-29 DIAGNOSIS — J9600 Acute respiratory failure, unspecified whether with hypoxia or hypercapnia: Secondary | ICD-10-CM

## 2011-11-29 DIAGNOSIS — F1190 Opioid use, unspecified, uncomplicated: Secondary | ICD-10-CM

## 2011-11-29 DIAGNOSIS — A419 Sepsis, unspecified organism: Principal | ICD-10-CM | POA: Diagnosis present

## 2011-11-29 DIAGNOSIS — R6521 Severe sepsis with septic shock: Secondary | ICD-10-CM | POA: Diagnosis present

## 2011-11-29 DIAGNOSIS — IMO0002 Reserved for concepts with insufficient information to code with codable children: Secondary | ICD-10-CM | POA: Diagnosis not present

## 2011-11-29 DIAGNOSIS — G9340 Encephalopathy, unspecified: Secondary | ICD-10-CM

## 2011-11-29 HISTORY — DX: Essential (primary) hypertension: I10

## 2011-11-29 HISTORY — DX: Unspecified viral hepatitis C without hepatic coma: B19.20

## 2011-11-29 HISTORY — DX: Polyneuropathy, unspecified: G62.9

## 2011-11-29 HISTORY — DX: Unspecified chronic bronchitis: J42

## 2011-11-29 LAB — COMPREHENSIVE METABOLIC PANEL
ALT: 37 U/L (ref 0–53)
ALT: 39 U/L (ref 0–53)
AST: 81 U/L — ABNORMAL HIGH (ref 0–37)
AST: 121 U/L — ABNORMAL HIGH (ref 0–37)
Albumin: 3.3 g/dL — ABNORMAL LOW (ref 3.5–5.2)
Albumin: 2.8 g/dL — ABNORMAL LOW (ref 3.5–5.2)
Alkaline Phosphatase: 112 U/L (ref 39–117)
Alkaline Phosphatase: 95 U/L (ref 39–117)
GFR calc Af Amer: 10 mL/min — ABNORMAL LOW (ref >90)
GFR calc non Af Amer: 9 mL/min — ABNORMAL LOW (ref >90)
Glucose, Bld: 152 mg/dL — ABNORMAL HIGH (ref 70–99)
Potassium: 5.3 mEq/L — ABNORMAL HIGH (ref 3.5–5.1)
Sodium: 131 mEq/L — ABNORMAL LOW (ref 135–145)
Sodium: 133 mEq/L — ABNORMAL LOW (ref 135–145)
Total Bilirubin: 0.6 mg/dL (ref 0.3–1.2)
Total Protein: 7.9 g/dL (ref 6.0–8.3)
Total Protein: 6.9 g/dL (ref 6.0–8.3)

## 2011-11-29 LAB — URINE MICROSCOPIC-ADD ON

## 2011-11-29 LAB — BLOOD GAS, ARTERIAL
Acid-base deficit: 7.1 mmol/L — ABNORMAL HIGH (ref 0.0–2.0)
Bicarbonate: 23.7 mEq/L (ref 20.0–24.0)
Bicarbonate: 21 mEq/L (ref 20.0–24.0)
Drawn by: 129801
Patient temperature: 98.6
TCO2: 20.6 mmol/L (ref 0–100)
pCO2 arterial: 60.6 mmHg (ref 35.0–45.0)
pH, Arterial: 7.188 — CL (ref 7.350–7.450)
pH, Arterial: 7.22 — ABNORMAL LOW (ref 7.350–7.450)
pO2, Arterial: 84.7 mmHg (ref 80.0–100.0)

## 2011-11-29 LAB — URINALYSIS, ROUTINE W REFLEX MICROSCOPIC
Hgb urine dipstick: NEGATIVE
Protein, ur: 30 mg/dL — AB
Specific Gravity, Urine: 1.028 (ref 1.005–1.030)
pH: 5.5 (ref 5.0–8.0)

## 2011-11-29 LAB — DIFFERENTIAL
Basophils Relative: 0 % (ref 0–1)
Eosinophils Relative: 1 % (ref 0–5)
Lymphocytes Relative: 28 % (ref 12–46)
Lymphs Abs: 3.1 10*3/uL (ref 0.7–4.0)
Monocytes Relative: 10 % (ref 3–12)
Neutrophils Relative %: 61 % (ref 43–77)

## 2011-11-29 LAB — MAGNESIUM: Magnesium: 2.2 mg/dL (ref 1.5–2.5)

## 2011-11-29 LAB — CORTISOL: Cortisol, Plasma: 22 ug/dL

## 2011-11-29 LAB — CARDIAC PANEL(CRET KIN+CKTOT+MB+TROPI)
CK, MB: 117.6 ng/mL (ref 0.3–4.0)
CK, MB: 126.5 ng/mL (ref 0.3–4.0)
Troponin I: <0.3 ng/mL (ref <0.30)

## 2011-11-29 LAB — ABO/RH: ABO/RH(D): O POS

## 2011-11-29 LAB — CBC
HCT: 40.8 % (ref 39.0–52.0)
MCHC: 32.1 g/dL (ref 30.0–36.0)
MCV: 94 fL (ref 78.0–100.0)
Platelets: 170 10*3/uL (ref 150–400)
WBC: 10.9 10*3/uL — ABNORMAL HIGH (ref 4.0–10.5)

## 2011-11-29 LAB — GLUCOSE, CAPILLARY: Glucose-Capillary: 148 mg/dL — ABNORMAL HIGH (ref 70–99)

## 2011-11-29 LAB — PROTIME-INR: Prothrombin Time: 15.2 seconds (ref 11.6–15.2)

## 2011-11-29 LAB — TYPE AND SCREEN

## 2011-11-29 MED ORDER — LIDOCAINE HCL (CARDIAC) 20 MG/ML IV SOLN
INTRAVENOUS | Status: AC
  Filled 2011-11-29: qty 5

## 2011-11-29 MED ORDER — SODIUM CHLORIDE 0.9 % IV BOLUS (SEPSIS)
1000.0000 mL | Freq: Once | INTRAVENOUS | Status: AC
  Administered 2011-11-29: 1000 mL via INTRAVENOUS

## 2011-11-29 MED ORDER — ONDANSETRON HCL 4 MG/2ML IJ SOLN
4.0000 mg | Freq: Once | INTRAMUSCULAR | Status: AC
  Administered 2011-11-29: 4 mg via INTRAVENOUS

## 2011-11-29 MED ORDER — CHLORHEXIDINE GLUCONATE 0.12 % MT SOLN
15.0000 mL | Freq: Two times a day (BID) | OROMUCOSAL | Status: DC
  Administered 2011-11-29 – 2011-12-01 (×4): 15 mL via OROMUCOSAL
  Filled 2011-11-29 (×6): qty 15

## 2011-11-29 MED ORDER — NALOXONE HCL 1 MG/ML IJ SOLN
INTRAMUSCULAR | Status: AC
  Administered 2011-11-29: 1 mg
  Filled 2011-11-29: qty 2

## 2011-11-29 MED ORDER — ACETAMINOPHEN 325 MG PO TABS: 650.0000 mg | ORAL_TABLET | ORAL | Status: DC | PRN

## 2011-11-29 MED ORDER — VANCOMYCIN HCL IN DEXTROSE 1-5 GM/200ML-% IV SOLN
1000.0000 mg | Freq: Once | INTRAVENOUS | Status: AC
  Administered 2011-11-29: 1000 mg via INTRAVENOUS
  Filled 2011-11-29: qty 200

## 2011-11-29 MED ORDER — PIPERACILLIN-TAZOBACTAM 3.375 G IVPB 30 MIN
3.3750 g | Freq: Once | INTRAVENOUS | Status: DC
  Filled 2011-11-29: qty 50

## 2011-11-29 MED ORDER — TRAMADOL HCL 50 MG PO TABS: 100.0000 mg | ORAL_TABLET | Freq: Four times a day (QID) | ORAL | Status: DC | PRN

## 2011-11-29 MED ORDER — SUCCINYLCHOLINE CHLORIDE 20 MG/ML IJ SOLN
INTRAMUSCULAR | Status: AC
  Filled 2011-11-29: qty 5

## 2011-11-29 MED ORDER — BIOTENE DRY MOUTH MT LIQD
15.0000 mL | Freq: Two times a day (BID) | OROMUCOSAL | Status: DC
  Administered 2011-12-01 (×2): 15 mL via OROMUCOSAL

## 2011-11-29 MED ORDER — ONDANSETRON HCL 4 MG/2ML IJ SOLN: 4.0000 mg | Freq: Four times a day (QID) | INTRAMUSCULAR | Status: DC | PRN

## 2011-11-29 MED ORDER — VANCOMYCIN HCL IN DEXTROSE 1-5 GM/200ML-% IV SOLN: 1000.0000 mg | INTRAVENOUS | Status: DC

## 2011-11-29 MED ORDER — ALBUTEROL SULFATE (5 MG/ML) 0.5% IN NEBU
2.5000 mg | INHALATION_SOLUTION | RESPIRATORY_TRACT | Status: DC
  Administered 2011-11-30 (×3): 2.5 mg via RESPIRATORY_TRACT
  Filled 2011-11-29 (×3): qty 0.5

## 2011-11-29 MED ORDER — NALOXONE HCL 1 MG/ML IJ SOLN: 1.0000 mg | Freq: Once | INTRAMUSCULAR | Status: DC

## 2011-11-29 MED ORDER — NALOXONE HCL 0.4 MG/ML IJ SOLN: 0.4000 mg | Freq: Once | INTRAMUSCULAR | Status: DC

## 2011-11-29 MED ORDER — ONDANSETRON HCL 4 MG/2ML IJ SOLN
INTRAMUSCULAR | Status: AC
  Filled 2011-11-29: qty 2

## 2011-11-29 MED ORDER — SODIUM CHLORIDE 0.9 % IV BOLUS (SEPSIS)
500.0000 mL | Freq: Once | INTRAVENOUS | Status: AC
  Administered 2011-11-29: 500 mL via INTRAVENOUS

## 2011-11-29 MED ORDER — PIPERACILLIN-TAZOBACTAM IN DEX 2-0.25 GM/50ML IV SOLN
2.2500 g | Freq: Four times a day (QID) | INTRAVENOUS | Status: DC
  Administered 2011-11-29 – 2011-11-30 (×3): 2.25 g via INTRAVENOUS
  Filled 2011-11-29 (×5): qty 50

## 2011-11-29 MED ORDER — SODIUM CHLORIDE 0.9 % IV BOLUS (SEPSIS)
750.0000 mL | Freq: Once | INTRAVENOUS | Status: AC
  Administered 2011-11-29: 750 mL via INTRAVENOUS

## 2011-11-29 MED ORDER — PIPERACILLIN-TAZOBACTAM 3.375 G IVPB
3.3750 g | Freq: Once | INTRAVENOUS | Status: AC
  Administered 2011-11-29: 3.375 g via INTRAVENOUS
  Filled 2011-11-29: qty 50

## 2011-11-29 MED ORDER — NALOXONE HCL 0.4 MG/ML IJ SOLN
0.2500 mg/h | INTRAVENOUS | Status: DC
  Administered 2011-11-29 – 2011-11-30 (×3): 0.25 mg/h via INTRAVENOUS
  Administered 2011-11-30: 0.125 mg/h via INTRAVENOUS
  Filled 2011-11-29 (×4): qty 2.5

## 2011-11-29 MED ORDER — IPRATROPIUM BROMIDE 0.02 % IN SOLN
0.5000 mg | RESPIRATORY_TRACT | Status: DC
  Administered 2011-11-30 (×3): 0.5 mg via RESPIRATORY_TRACT
  Filled 2011-11-29 (×3): qty 2.5

## 2011-11-29 MED ORDER — PANTOPRAZOLE SODIUM 40 MG IV SOLR
40.0000 mg | Freq: Every day | INTRAVENOUS | Status: DC
  Administered 2011-11-29: 40 mg via INTRAVENOUS
  Filled 2011-11-29 (×2): qty 40

## 2011-11-29 MED ORDER — NALOXONE HCL 1 MG/ML IJ SOLN
1.0000 mg | Freq: Once | INTRAMUSCULAR | Status: AC
  Administered 2011-11-29: 1 mg via INTRAVENOUS
  Filled 2011-11-29: qty 2

## 2011-11-29 MED ORDER — SODIUM CHLORIDE 0.9 % IV BOLUS (SEPSIS)
25.0000 mL/kg | Freq: Once | INTRAVENOUS | Status: AC
  Administered 2011-11-29: 1830 mL via INTRAVENOUS

## 2011-11-29 MED ORDER — ALBUTEROL SULFATE (5 MG/ML) 0.5% IN NEBU
2.5000 mg | INHALATION_SOLUTION | Freq: Four times a day (QID) | RESPIRATORY_TRACT | Status: DC
  Administered 2011-11-29 (×2): 2.5 mg via RESPIRATORY_TRACT
  Filled 2011-11-29 (×2): qty 0.5

## 2011-11-29 MED ORDER — SODIUM CHLORIDE 0.9 % IV SOLN
INTRAVENOUS | Status: DC
  Administered 2011-11-30 (×2): 1000 mL via INTRAVENOUS
  Administered 2011-11-30: 18:00:00 via INTRAVENOUS

## 2011-11-29 MED ORDER — SODIUM CHLORIDE 0.9 % IV SOLN
INTRAVENOUS | Status: DC
  Administered 2011-11-29 – 2011-11-30 (×2): via INTRAVENOUS

## 2011-11-29 MED ORDER — HALOPERIDOL LACTATE 5 MG/ML IJ SOLN
2.0000 mg | INTRAMUSCULAR | Status: DC | PRN
  Administered 2011-11-29 – 2011-12-01 (×2): 2 mg via INTRAVENOUS
  Filled 2011-11-29 (×2): qty 1

## 2011-11-29 MED ORDER — NALOXONE HCL 1 MG/ML IJ SOLN
0.5000 mg | Freq: Once | INTRAMUSCULAR | Status: AC
  Administered 2011-11-29: 0.5 mg via INTRAVENOUS

## 2011-11-29 MED ORDER — ETOMIDATE 2 MG/ML IV SOLN
INTRAVENOUS | Status: AC
  Filled 2011-11-29: qty 20

## 2011-11-29 MED ORDER — IPRATROPIUM BROMIDE 0.02 % IN SOLN
0.5000 mg | RESPIRATORY_TRACT | Status: DC
  Administered 2011-11-29 (×2): 0.5 mg via RESPIRATORY_TRACT
  Filled 2011-11-29 (×2): qty 2.5

## 2011-11-29 MED ORDER — ROCURONIUM BROMIDE 50 MG/5ML IV SOLN
INTRAVENOUS | Status: AC
  Filled 2011-11-29: qty 2

## 2011-11-29 NOTE — Progress Notes (Signed)
Confirmed pcp is Fleet Contras

## 2011-11-29 NOTE — ED Notes (Signed)
Attempted to call report to ICU, states the bed is not ready and should be clean in approx 15 min and they will call me back

## 2011-11-29 NOTE — Progress Notes (Signed)
ED CM inquired about the name of the family Dr from family member at bedside Pending a call back from his wife

## 2011-11-29 NOTE — Progress Notes (Addendum)
eLink Physician-Brief Progress Note Patient Name: Xavier White DOB: 06/18/1946 MRN: [id number]  Date of Service  11/29/2011   HPI/Events of Note  Camera care routine   - noticed with bedside RN that current RASS -3 while on narcan gtt   - MAP 57 with sbo 82    eICU Interventions  Recheck abg stat (prior ones with resp acidosis) FLuid bolus stat 1L and reassess   Lab 11/29/11 1948 11/29/11 1735 11/29/11 1420  PHART 7.220* 7.177* 7.188*  PCO2ART 53.2* 60.6* 64.8*  PO2ART 84.7 281.0* 72.4*  HCO3 21.0 21.6 23.7  TCO2 19.7 20.6 22.7  O2SAT 96.9 99.1 90.5   abg better bp now 93/47 with map 57  Plan 8:21 PM Continue to monitor    Intervention Category Major Interventions: Acid-Base disturbance - evaluation and management;Hypercarbia - evaluation and management;Hypovolemia - evaluation and treatment with fluids;Hypotension - evaluation and management;Respiratory failure - evaluation and management;Change in mental status - evaluation and management  Elick Aguilera 11/29/2011, 7:12 PM
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
## 2011-11-29 NOTE — Progress Notes (Signed)
Male family member assisted with importance medicare form

## 2011-11-29 NOTE — ED Notes (Signed)
Pt back from CT

## 2011-11-29 NOTE — ED Notes (Addendum)
Critical care paged to alert of pts ABG results

## 2011-11-29 NOTE — Significant Event (Signed)
Called to assess pt with hypotension and lethargy.  ABG showed acidosis.  Concern he may need intubation.  He was given additional dose of IV narcan and had improvement in mental status and blood pressure.  Following commands and moving all extremities after narcan.  Will start narcan infusion, continue supplemental oxygen and NIPPV as needed.  Will  F/u ABG after starting narcan gtt.  Will continue IV fluids.  No indication for central line or pressor agents at this time.  Will use haldol prn for agitation.  Will see if he can use acetaminophen or tramadol for pain control.  Hopefully can avoid need for intubation.  Additional critical care time 30 minutes.  Coralyn Helling, MD 11/29/2011, 3:54 PM Pager:  [phone]

## 2011-11-29 NOTE — ED Notes (Signed)
md at bedside

## 2011-11-29 NOTE — ED Notes (Signed)
md alerted of pts blood pressure status.

## 2011-11-29 NOTE — Progress Notes (Signed)
E Link called and notified of low BP and no change in patient's mental status. Patient remains very lethargic on narcan drip. He is difficult to arouse and will only only his eyes for a second and then go back to sleep. He is still able to squeeze my hands on command.

## 2011-11-29 NOTE — ED Notes (Addendum)
Pt to CT pt blood pressure 93/55 before taken to CT with 2 bolus NS going

## 2011-11-29 NOTE — ED Notes (Signed)
md alerted of pts blood pressure and mental status

## 2011-11-29 NOTE — ED Notes (Signed)
Pt given narcan and now pt is fully alert and oriented x4 and screaming.

## 2011-11-29 NOTE — ED Notes (Signed)
Pt is on 4th bag of NS fluids.

## 2011-11-29 NOTE — H&P (Signed)
Name: Xavier White MRN: [id number] DOB: 1946/06/27    LOS: 0 Requesting GE:[id number] Regarding: Shock/hypoxia  PCCM NOTE  History of Present Illness: 66 yo wm with chronic back pain and treated at local pain clinic. Family reports 24 hours of somnolence, confusion and low grade fever. Presented to [facility] ED 5/13 with 85/65 bp. Tx with narcan with abrupt arousal and complaints of pain. Creatine >6(on ace-i) Urine appeared dark and PCCM asked to admit.  Lines / Drains:   Cultures: 5/13 bc x 2>> 5/13 uc>> 5/13 sputum if intubated>> Antibiotics: 5/15 vanc>> 5/15 zoysn>>  Tests / Events: 5/13 somulent and hypotensive  Subjective:  Past Medical History  Diagnosis Date  . Hepatitis C   . Diabetes mellitus   . Hypertension   . Neuropathy     feet  . Bronchitis, chronic    Past Surgical History  Procedure Date  . Back surgery     5  . Cholecystectomy   . Spinal cord stimulator insertion    Prior to Admission medications   Medication Sig Start Date End Date Taking? Authorizing Provider  esomeprazole (NEXIUM) 40 MG capsule Take 40 mg by mouth daily before breakfast.   Yes Historical Provider, MD  lisinopril (PRINIVIL,ZESTRIL) 40 MG tablet Take 40 mg by mouth daily.   Yes Historical Provider, MD  metFORMIN (GLUCOPHAGE) 500 MG tablet Take 500 mg by mouth 2 (two) times daily with a meal.   Yes Historical Provider, MD  morphine (MS CONTIN) 60 MG 12 hr tablet Take 60 mg by mouth 3 (three) times daily as needed. pain   Yes Historical Provider, MD  oxycodone (ROXICODONE) 30 MG immediate release tablet Take 30 mg by mouth 4 (four) times daily as needed. pain   Yes Historical Provider, MD  pregabalin (LYRICA) 75 MG capsule Take 150 mg by mouth 2 (two) times daily.   Yes Historical Provider, MD  simvastatin (ZOCOR) 20 MG tablet Take  20 mg by mouth every evening.   Yes Historical Provider, MD   Allergies No Known Allergies  Family History History reviewed. No pertinent family history.  Social History  reports that he has quit smoking. He does not have any smokeless tobacco history on file. He reports that he does not drink alcohol. His drug history not on file.  Review Of Systems  na  Vital Signs: Temp:  [99.2 F (37.3 C)-100 F (37.8 C)] 100 F (37.8 C) (05/13 0931) Pulse Rate:  [71-102] 82  (05/13 1320) Resp:  [10-25] 15  (05/13 1320) BP: (79-153)/(41-92) 88/49 mmHg (05/13 1320) SpO2:  [88 %-98 %] 94 % (05/13 1320)    Physical Examination: General:  WNWDWM somulent Neuro:  Difficult to arouse. Pupils pinpoint   HEENT:  Short neck Cardiovascular:  hsr rrr Lungs:  Decreased bs bases Abdomen:  +bs Musculoskeletal:  intact Skin:  intact  Ventilator settings:  Labs and Imaging:  Dg Chest 2 View  11/29/2011  *RADIOLOGY REPORT*  Clinical Data: Weakness, nausea, vomiting, seizure-like activity this morning  CHEST - 1 VIEW  Comparison:  08/21/2003  Findings:  Normal cardiac silhouette and mediastinal contours.  Lung volumes remain persistently reduced with perihilar heterogeneous opacities. There is persistent mild elevation of the right hemidiaphragm.  No focal airspace opacities.  No pleural effusion or pneumothorax. Spinal stimulator overlies the lower thoracic spine.  Post cholecystectomy.  IMPRESSION: Decreased lung volumes without acute cardiopulmonary disease.  Original Report Authenticated By: Waynard Reeds, M.D.   Ct Head Wo Contrast  11/29/2011  *RADIOLOGY REPORT*  Clinical Data:  Pain.  Confusion.  Fall episodes.  CT HEAD WITHOUT CONTRAST  Technique: Contiguous axial images were obtained from the base of the skull through the vertex without intravenous contrast.  Comparison:   None.  Findings:  No mass effect, midline shift, or acute intracranial hemorrhage.  Mastoid air cells are clear.  Near  complete opacification of the anterior ethmoid air cells and right frontal sinus is noted.  No obvious acute bony deformity.  No cranial fracture.  Mild global atrophy appropriate to age.  IMPRESSION:  No acute intracranial pathology.  Chronic-appearing inflammatory changes in the paranasal sinuses.  Jolaine Click, M.D.  Original Report Authenticated By: Donavan Burnet, M.D.    Lab 11/29/11 0910  NA 131*  K 5.1  CL 90*  CO2 23  BUN 56*  CREATININE 5.95*  GLUCOSE 152*    Lab 11/29/11 0910  HGB 13.1  HCT 40.8  WBC 10.9*  PLT 170   ABG No results found for this basename: phart, pco2, pco2art, po2, po2art, hco3, tco2, acidbasedef, o2sat    Assessment and Plan: Shock from presumed sepsis ? Urinary source complicated by chronic narcotics. -admit to icu -sepsis protocol -empirical abx  Renal failure in setting of ACE-I, presumed sepsis -dc ace-i -fluid resuscitation -place cvl to guide fluids if needed. He refuses -renal US Chronic Pain -dc narcotics till awake -narcan wakes him up, may need drip  Recent fall from ladder with injury to back -check lumbar films  Best practices / Disposition: -->ICU status under PCCM -->full code -->Heparin for DVT Px -->Protonix for GI Px -->ventilator bundle -->diet -->family updated at bedside  [facility] ACNP Adolph Pollack PCCM Pager [phone] till 3 pm If no answer page [phone] 11/29/2011, 1:28 PM  Will admit to the ICU and treat.  Hypotension likely related to narcs.  Renal failure likely related to obstruction (patient unable to void).  Will treat as septic shock, pan culture and will monitor.  CC time 55 min.  Patient seen and examined, agree with above note.  I dictated the care and orders written for this patient under my direction.  Koren Bound, M.D. [phone]

## 2011-11-29 NOTE — Progress Notes (Signed)
ANTIBIOTIC CONSULT NOTE - INITIAL  Pharmacy Consult for Vanco and Zosyn Indication: rule out sepsis  No Known Allergies  Patient Measurements: Height: 5\' 5"  (165.1 cm) Weight: 200 lb (90.719 kg) (guestimate from ED) IBW/kg (Calculated) : 61.5   Vital Signs: Temp: 100 F (37.8 C) (05/13 0931) Temp src: Rectal (05/13 0931) BP: 124/66 mmHg (05/13 1340) Pulse Rate: 90  (05/13 1335) Intake/Output from previous day:   Intake/Output from this shift:    Labs:  [facility] 11/29/11 0910  WBC 10.9*  HGB 13.1  PLT 170  LABCREA --  CREATININE 5.95*   Estimated Creatinine Clearance: 12.8 ml/min (by C-G formula based on Cr of 5.95). No results found for this basename: VANCOTROUGH:2,VANCOPEAK:2,VANCORANDOM:2,GENTTROUGH:2,GENTPEAK:2,GENTRANDOM:2,TOBRATROUGH:2,TOBRAPEAK:2,TOBRARND:2,AMIKACINPEAK:2,AMIKACINTROU:2,AMIKACIN:2, in the last 72 hours   Microbiology: No results found for this or any previous visit (from the past 720 hour(s)).  Medical History: Past Medical History  Diagnosis Date  . Hepatitis C   . Diabetes mellitus   . Hypertension   . Neuropathy     feet  . Bronchitis, chronic     Medications:  Anti-infectives     Start     Dose/Rate Route Frequency Ordered Stop   11/29/11 1430   piperacillin-tazobactam (ZOSYN) IVPB 3.375 g  Status:  Discontinued        3.375 g 100 mL/hr over 30 Minutes Intravenous  Once 11/29/11 1349 11/29/11 1351   11/29/11 1100  piperacillin-tazobactam (ZOSYN) IVPB 3.375 g       3.375 g 12.5 mL/hr over 240 Minutes Intravenous  Once 11/29/11 1053     11/29/11 1100   vancomycin (VANCOCIN) IVPB 1000 mg/200 mL premix        1,000 mg 200 mL/hr over 60 Minutes Intravenous  Once 11/29/11 1053 11/29/11 1247         Assessment: 66 yo M admitted 5/13 with somnolence, confusion, and low-grade fever. Per MD note, given narcan with abrupt arousal and complaints of pain. Zosyn 3.375g x1 given in ER at 13:38 and Vanco 1g x1 given in ER at 11:47 today  already. Note current SCr=5.95, CrCl ~ 13 ml/min so will need to monitor doses and renal function closely. Starting Vanco and Zosyn per Rx for possible sepsis.  Goal of Therapy:  Vancomycin trough level 15-20 mcg/ml  Plan:  1) Zosyn 2.25g IV q6h - next due at 22:00 2) Vanco 1g IV q48h - next due 5/15 at 12:00 3) Follow up weight once patient gets a bed (currently using guestimated weight) 4) Follow renal function closely.  Darrol Angel, PharmD Pager: [phone] 11/29/2011,1:55 PM

## 2011-11-29 NOTE — ED Notes (Signed)
Pt responds to painful stimuli and opens eyes and follows commands, returns to sleeping state after, VS WNL at this time, pt remains on monitor

## 2011-11-29 NOTE — Progress Notes (Signed)
eLink Physician-Brief Progress Note Patient Name: Xavier White DOB: 09-22-1945 MRN: [id number]  Date of Service  11/29/2011   HPI/Events of Note   Hypotension  eICU Interventions  NS bolus given    Intervention Category Major Interventions: Hypotension - evaluation and management  Shan Levans 11/29/2011, 11:17 PM

## 2011-11-29 NOTE — ED Provider Notes (Addendum)
History     CSN: [id number]  Arrival date & time 11/29/11  4696   First MD Initiated Contact with Patient 11/29/11 [phone]      Chief Complaint  Patient presents with  . Altered Mental Status  . Nausea  . Emesis    (Consider location/radiation/quality/duration/timing/severity/associated sxs/prior treatment) Patient is a 66 y.o. male presenting with altered mental status and vomiting. The history is provided by the patient and the spouse.  Altered Mental Status Pertinent negatives include no chest pain, no abdominal pain, no headaches and no shortness of breath.  Emesis  Pertinent negatives include no abdominal pain, no chills, no cough, no diarrhea, no fever, no headaches and no myalgias.  pt with generalized weakness for past few days. Intermittent nausea/vomiting yesterday, few episodes, not bloody or bilious. No emesis today. Having normal bms. No abd pain. Difficulty urinating today, has not voided. ?decreased po intake in past day. Hx chronic low back pain, ddd, surgery for same. Denies acute or abrupt worsening of back pain. No focal leg weakness or numbness, no perineal numbness. States had fallen approximately 1-2 weeks ago, unsure what caused fall, denies loc. Pt denies headache. No neck pain. No current or recent chest pain or discomfort. Denies sob, although pulse ox noted low on arrival. No abd pain. No dysuria, denies hx uti. No fever or chills. Denies recent change in meds or new meds. Denies overuse of pain meds.     No past medical history on file.  No past surgical history on file.  No family history on file.  History  Substance Use Topics  . Smoking status: Not on file  . Smokeless tobacco: Not on file  . Alcohol Use: Not on file      Review of Systems  Constitutional: Negative for fever, chills and diaphoresis.  HENT: Negative for sore throat, neck pain and neck stiffness.   Eyes: Negative for redness and visual disturbance.  Respiratory: Negative for  cough and shortness of breath.   Cardiovascular: Negative for chest pain.  Gastrointestinal: Positive for vomiting. Negative for abdominal pain and diarrhea.  Genitourinary: Negative for dysuria and flank pain.  Musculoskeletal: Negative for myalgias.  Skin: Negative for rash.  Neurological: Negative for headaches.  Hematological: Does not bruise/bleed easily.  Psychiatric/Behavioral: Positive for altered mental status. Negative for dysphoric mood.    Allergies  Review of patient's allergies indicates not on file.  Home Medications  No current outpatient prescriptions on file.  BP 88/53  Pulse 71  Temp(Src) 99.2 F (37.3 C) (Oral)  SpO2 88%  Physical Exam  Nursing note and vitals reviewed. Constitutional: He is oriented to person, place, and time. He appears well-developed and well-nourished. No distress.  HENT:  Head: Atraumatic.  Eyes: Conjunctivae are normal. Pupils are equal, round, and reactive to light. No scleral icterus.  Neck: Neck supple. No tracheal deviation present.       No stiffness or rigidity  Cardiovascular: Normal rate, regular rhythm, normal heart sounds and intact distal pulses.   Pulmonary/Chest: Effort normal and breath sounds normal. No accessory muscle usage. No respiratory distress.  Abdominal: Soft. Bowel sounds are normal. He exhibits no distension and no mass. There is no tenderness. There is no rebound and no guarding.  Genitourinary:       No cva tenderness  Musculoskeletal: Normal range of motion. He exhibits no edema and no tenderness.  Neurological: He is alert and oriented to person, place, and time.       Motor intact  bil.   Skin: Skin is warm and dry.  Psychiatric: He has a normal mood and affect.    ED Course  Procedures (including critical care time)  Labs Reviewed  GLUCOSE, CAPILLARY - Abnormal; Notable for the following:    Glucose-Capillary 148 (*)    All other components within normal limits  COMPREHENSIVE METABOLIC PANEL    CBC  DIFFERENTIAL  URINALYSIS, ROUTINE W REFLEX MICROSCOPIC  AMMONIA  LACTIC ACID, PLASMA  TROPONIN I  CULTURE, BLOOD (ROUTINE X 2)  CULTURE, BLOOD (ROUTINE X 2)  D-DIMER, QUANTITATIVE   Results for orders placed during the hospital encounter of 11/29/11  GLUCOSE, CAPILLARY      Component Value Range   Glucose-Capillary 148 (*) 70 - 99 (mg/dL)   Comment 1 Notify RN    COMPREHENSIVE METABOLIC PANEL      Component Value Range   Sodium 131 (*) 135 - 145 (mEq/L)   Potassium 5.1  3.5 - 5.1 (mEq/L)   Chloride 90 (*) 96 - 112 (mEq/L)   CO2 23  19 - 32 (mEq/L)   Glucose, Bld 152 (*) 70 - 99 (mg/dL)   BUN 56 (*) 6 - 23 (mg/dL)   Creatinine, Ser 9.60 (*) 0.50 - 1.35 (mg/dL)   Calcium 8.9  8.4 - 45.4 (mg/dL)   Total Protein 7.9  6.0 - 8.3 (g/dL)   Albumin 3.3 (*) 3.5 - 5.2 (g/dL)   AST 81 (*) 0 - 37 (U/L)   ALT 37  0 - 53 (U/L)   Alkaline Phosphatase 112  39 - 117 (U/L)   Total Bilirubin 0.6  0.3 - 1.2 (mg/dL)   GFR calc non Af Amer 9 (*) >90 (mL/min)   GFR calc Af Amer 10 (*) >90 (mL/min)  CBC      Component Value Range   WBC 10.9 (*) 4.0 - 10.5 (K/uL)   RBC 4.34  4.22 - 5.81 (MIL/uL)   Hemoglobin 13.1  13.0 - 17.0 (g/dL)   HCT 09.8  11.9 - 14.7 (%)   MCV 94.0  78.0 - 100.0 (fL)   MCH 30.2  26.0 - 34.0 (pg)   MCHC 32.1  30.0 - 36.0 (g/dL)   RDW 82.9  56.2 - 13.0 (%)   Platelets 170  150 - 400 (K/uL)  DIFFERENTIAL      Component Value Range   Neutrophils Relative 61  43 - 77 (%)   Lymphocytes Relative 28  12 - 46 (%)   Monocytes Relative 10  3 - 12 (%)   Eosinophils Relative 1  0 - 5 (%)   Basophils Relative 0  0 - 1 (%)   Neutro Abs 6.6  1.7 - 7.7 (K/uL)   Lymphs Abs 3.1  0.7 - 4.0 (K/uL)   Monocytes Absolute 1.1 (*) 0.1 - 1.0 (K/uL)   Eosinophils Absolute 0.1  0.0 - 0.7 (K/uL)   Basophils Absolute 0.0  0.0 - 0.1 (K/uL)   WBC Morphology ATYPICAL LYMPHOCYTES    URINALYSIS, ROUTINE W REFLEX MICROSCOPIC      Component Value Range   Color, Urine AMBER (*) YELLOW     APPearance TURBID (*) CLEAR    Specific Gravity, Urine 1.028  1.005 - 1.030    pH 5.5  5.0 - 8.0    Glucose, UA NEGATIVE  NEGATIVE (mg/dL)   Hgb urine dipstick NEGATIVE  NEGATIVE    Bilirubin Urine SMALL (*) NEGATIVE    Ketones, ur TRACE (*) NEGATIVE (mg/dL)   Protein, ur 30 (*) NEGATIVE (mg/dL)  Urobilinogen, UA 0.2  0.0 - 1.0 (mg/dL)   Nitrite NEGATIVE  NEGATIVE    Leukocytes, UA NEGATIVE  NEGATIVE   AMMONIA      Component Value Range   Ammonia 57  11 - 60 (umol/L)  LACTIC ACID, PLASMA      Component Value Range   Lactic Acid, Venous 1.3  0.5 - 2.2 (mmol/L)  TROPONIN I      Component Value Range   Troponin I <0.30  <0.30 (ng/mL)  TYPE AND SCREEN      Component Value Range   ABO/RH(D) O POS     Antibody Screen NEG     Sample Expiration 12/02/2011    ABO/RH      Component Value Range   ABO/RH(D) O POS    URINE MICROSCOPIC-ADD ON      Component Value Range   Squamous Epithelial / LPF FEW (*) RARE    WBC, UA 3-6  <3 (WBC/hpf)   RBC / HPF 0-2  <3 (RBC/hpf)   Bacteria, UA MANY (*) RARE    Casts HYALINE CASTS (*) NEGATIVE    Urine-Other MUCOUS PRESENT      Note that cxr and ct readings not crossing over-  Reviewed radiologist interpretation on films, cxr neg acute, ct head neg acute.     MDM  Iv ns bolus as bp low.   Continuous pulse ox and monitor. Labs, xrays, ct.   Nursing notes reviewed.   Additional hx from spouse and prior charts/records.  Pt/spouse deny any overuse of meds/pain meds.     Date: 11/29/2011  Rate: 71  Rhythm: normal sinus rhythm  QRS Axis: normal  Intervals: normal  ST/T Wave abnormalities: normal  Conduction Disutrbances:none  Narrative Interpretation:   Old EKG Reviewed: unchanged   ua with many bacteria, urine culture sent.   Fever, ? Uti, hypotensive, Zosyn and vanc iv. Blood and urine cultures sent.  Given hypotension, renal insuff, additional ns iv bolus given.   Critical care medicine called.   Recheck pt, awake and alert,  no c/o pain currently x his chronic back pain which he says is at baseline. Pt does fall asleep easily, but is easily aroused. No neck stiffness or rigidity. abd soft nt.   Recheck pupils vsmall, more drowsy, falls asleep while talking to pt.   Pt noted on heavy doses morphine for chronic pain, given ms changes, hypotension, will give narcan.  zofran iv.     CRITICAL CARE Performed by: Suzi Roots   Total critical care time: 70  Critical care time was exclusive of separately billable procedures and treating other patients.  Critical care was necessary to treat or prevent imminent or life-threatening deterioration.  Critical care was time spent personally by me on the following activities: development of treatment plan with patient and/or surrogate as well as nursing, discussions with consultants, evaluation of patient's response to treatment, examination of patient, obtaining history from patient or surrogate, ordering and performing treatments and interventions, ordering and review of laboratory studies, ordering and review of radiographic studies, pulse oximetry and re-evaluation of patient's condition.    Recheck almost immediately more awake and alert post narcan, pt becomes agitated/anxious. zofran and ativan 1 mg iv. bp improved.           Suzi Roots, MD 11/29/11 1236  Suzi Roots, MD 11/29/11 [phone]

## 2011-11-29 NOTE — ED Notes (Addendum)
yacoub gave rn verbal order to start bipap. rn read abg results to dr.  Elinor Parkinson called

## 2011-11-29 NOTE — Progress Notes (Signed)
Patient lethargic, difficult to arouse, only able to follow simple commands with multiple verbal prompts. ABG resulted. DR. Marchelle Gearing called and updated on patient's status.

## 2011-11-30 ENCOUNTER — Inpatient Hospital Stay (HOSPITAL_COMMUNITY): Payer: Medicare Other

## 2011-11-30 LAB — CARDIAC PANEL(CRET KIN+CKTOT+MB+TROPI)
CK, MB: 91.9 ng/mL (ref 0.3–4.0)
Relative Index: 1.7 (ref 0.0–2.5)
Total CK: 5549 U/L — ABNORMAL HIGH (ref 7–232)
Troponin I: <0.3 ng/mL (ref <0.30)

## 2011-11-30 LAB — GLUCOSE, CAPILLARY: Glucose-Capillary: 90 mg/dL (ref 70–99)

## 2011-11-30 LAB — BLOOD GAS, ARTERIAL
Bicarbonate: 20.5 mEq/L (ref 20.0–24.0)
TCO2: 19.5 mmol/L (ref 0–100)
pCO2 arterial: 49.9 mmHg — ABNORMAL HIGH (ref 35.0–45.0)
pH, Arterial: 7.238 — ABNORMAL LOW (ref 7.350–7.450)
pO2, Arterial: 74.3 mmHg — ABNORMAL LOW (ref 80.0–100.0)

## 2011-11-30 LAB — URINE CULTURE
Colony Count: NO GROWTH
Culture: NO GROWTH

## 2011-11-30 LAB — LEGIONELLA ANTIGEN, URINE: Legionella Antigen, Urine: NEGATIVE

## 2011-11-30 LAB — BASIC METABOLIC PANEL
BUN: 33 mg/dL — ABNORMAL HIGH (ref 6–23)
Chloride: 109 mEq/L (ref 96–112)
GFR calc non Af Amer: 32 mL/min — ABNORMAL LOW (ref >90)
Glucose, Bld: 113 mg/dL — ABNORMAL HIGH (ref 70–99)
Potassium: 5.3 mEq/L — ABNORMAL HIGH (ref 3.5–5.1)

## 2011-11-30 MED ORDER — CIPROFLOXACIN HCL 500 MG PO TABS
500.0000 mg | ORAL_TABLET | Freq: Two times a day (BID) | ORAL | Status: DC
  Administered 2011-11-30 – 2011-12-04 (×9): 500 mg via ORAL
  Filled 2011-11-30 (×13): qty 1

## 2011-11-30 MED ORDER — SODIUM POLYSTYRENE SULFONATE 15 GM/60ML PO SUSP
15.0000 g | Freq: Once | ORAL | Status: AC
  Administered 2011-11-30: 15 g via ORAL
  Filled 2011-11-30: qty 60

## 2011-11-30 MED ORDER — SODIUM CHLORIDE 0.9 % IV BOLUS (SEPSIS)
1000.0000 mL | Freq: Once | INTRAVENOUS | Status: AC
  Administered 2011-11-30: 1000 mL via INTRAVENOUS

## 2011-11-30 MED ORDER — METHOCARBAMOL 500 MG PO TABS
750.0000 mg | ORAL_TABLET | Freq: Four times a day (QID) | ORAL | Status: DC | PRN
  Administered 2011-11-30: 750 mg via ORAL
  Filled 2011-11-30: qty 2

## 2011-11-30 MED ORDER — INSULIN ASPART 100 UNIT/ML ~~LOC~~ SOLN
1.0000 [IU] | SUBCUTANEOUS | Status: DC | PRN
  Administered 2011-12-05 (×2): 1 [IU] via SUBCUTANEOUS

## 2011-11-30 MED ORDER — OXYCODONE-ACETAMINOPHEN 5-325 MG PO TABS
1.0000 | ORAL_TABLET | ORAL | Status: DC | PRN
  Administered 2011-11-30 – 2011-12-01 (×3): 2 via ORAL
  Filled 2011-11-30 (×3): qty 2

## 2011-11-30 MED ORDER — PANTOPRAZOLE SODIUM 40 MG PO TBEC
40.0000 mg | DELAYED_RELEASE_TABLET | Freq: Every day | ORAL | Status: DC
  Administered 2011-11-30: 40 mg via ORAL
  Filled 2011-11-30 (×2): qty 1

## 2011-11-30 MED ORDER — ALBUTEROL SULFATE (5 MG/ML) 0.5% IN NEBU: 2.5000 mg | INHALATION_SOLUTION | RESPIRATORY_TRACT | Status: DC | PRN

## 2011-11-30 MED ORDER — SODIUM CHLORIDE 0.9 % IV BOLUS (SEPSIS)
500.0000 mL | Freq: Once | INTRAVENOUS | Status: AC
  Administered 2011-11-30: 500 mL via INTRAVENOUS

## 2011-11-30 NOTE — Progress Notes (Signed)
Patient improving- he is awake and asking about his family. He remains lethargic but much more interactive than before. He assisted the nurse tech and I in turning him in bed and has been answering simple questions for the last few minutes.

## 2011-11-30 NOTE — Progress Notes (Signed)
eLink Physician-Brief Progress Note Patient Name: Xavier White DOB: 1945/10/13 MRN: [id number]  Date of Service  11/30/2011   HPI/Events of Note   Rn calling elink saying patient waking up and talking on phone.   eICU Interventions  Will cut narcan dose by 50% and monitor   Intervention Category Intermediate Interventions: Medication change / dose adjustment  Alby Schwabe 11/30/2011, 3:47 AM

## 2011-11-30 NOTE — Progress Notes (Signed)
CARE MANAGEMENT NOTE 11/30/2011  Patient:  Xavier White, Xavier White   Account Number:  [id number]  Date Initiated:  11/30/2011  Documentation initiated by:  Alixandrea Milleson  Subjective/Objective Assessment:   pt with ams and hx of recent fall without knowledge of cht, confusion and agitation present.     Action/Plan:   lives at home with wife   Anticipated DC Date:  12/03/2011   Anticipated DC Plan:  HOME/SELF CARE  In-house referral  NA      DC Planning Services  NA      [facility] Choice  NA   Choice offered to / List presented to:  NA   DME arranged  NA      DME agency  NA     HH arranged  NA      HH agency  NA   Status of service:  In process, will continue to follow Medicare Important Message given?  YES (If response is "NO", the following Medicare IM given date fields will be blank) Date Medicare IM given:  11/29/2011 Date Additional Medicare IM given:    Discharge Disposition:    Per UR Regulation:  Reviewed for med. necessity/level of care/duration of stay  If discussed at Long Length of Stay Meetings, dates discussed:    Comments:  [id number] Marcelle Smiling, RN,BSN,CCM No discharge needs present at time of this review Case Management [phone]

## 2011-11-30 NOTE — Progress Notes (Signed)
Patient awake and agitated.Despite re-orientation, patient does not understand why he is in the hospital. He denies speaking to his wife last night although she called and spoke to him around 3am. Patient has pulled out an IV and is cursing, stating that he wants to get out of the hospital right away. He is demanding we call his wife and explain to both of them why we are keeping him here. I have explained to the patient several times why he is in the hospital. I attempted 2x to call his wife but it goes to her voicemail. I placed the phone in the patient's bed and instructed him on how to make calls outside the hospital. Day shift RN is here and in room with patient at this time.

## 2011-11-30 NOTE — Progress Notes (Addendum)
eLink Physician-Brief Progress Note Patient Name: Xavier White DOB: 02-Jul-1946 MRN: [id number]  Date of Service  11/30/2011   HPI/Events of Note   MAP 56 with sbp 78 despite  3L IVF overnight Asymptomatic  RN says he is making urine  Intake/Output      05/13 0701 - 05/14 0700   I.V. (mL/kg) 12816.4 (118.1)   IV Piggyback 1860   Total Intake(mL/kg) 14676.4 (135.3)   Urine (mL/kg/hr) 4270 (1.6)   Total Output 4270   Net +10406.4          eICU Interventions  Will give another liter fluid Check cortisol If still low bp, consider cvl   Intervention Category Major Interventions: Hypotension - evaluation and management  Clotilda Hafer 11/30/2011, 6:10 AM

## 2011-11-30 NOTE — Progress Notes (Signed)
eLink Physician-Brief Progress Note Patient Name: Xavier White DOB: 1946-03-28 MRN: [id number]  Date of Service  11/30/2011   HPI/Events of Note  bp drifitng down again   eICU Interventions  1L fluid bolus   Intervention Category Major Interventions: Hypotension - evaluation and management  Ferrah Panagopoulos 11/30/2011, 4:06 AM

## 2011-11-30 NOTE — Progress Notes (Signed)
Patient's HOB lowered and O2 sats dropped from high 90s on 4L [place] down to the mid 50's on 4L [place]. HOB brought back up to 30 degrees and patient was instructed to take deep breaths. O2 slowly improved. This RN was standing at bedside the whole time, charge nurse present as well. O2 sats returned to normal within 2 minutes. Pt now at 97% on 4L . E link notified.

## 2011-11-30 NOTE — Progress Notes (Addendum)
Name: Xavier White MRN: [id number] DOB: 1945/08/15    LOS: 1 Requesting [id number] Regarding: Shock/hypoxia  PCCM NOTE  History of Present Illness: 66 yo wm with chronic back pain and treated at local pain clinic. Family reports 24 hours of somnolence, confusion and low grade fever. Presented to [facility] ED 5/13 with 85/65 bp. Tx with narcan with abrupt arousal and complaints of pain. Creatine >6(on ace-i) Urine appeared dark and PCCM asked to admit.  Lines / Drains: PIV  Cultures: 5/13 bc x 2>> 5/13 uc>> 5/13 sputum if intubated>>  Antibiotics: 5/13 vanc>>5/14 5/13 zoysn>>5/14 5/14 Cipro>>>  Tests / Events: 5/13 somulent and hypotensive 5/14 wide awake  Subjective: Vital Signs: Temp:  [99.8 F (37.7 C)-100 F (37.8 C)] 100 F (37.8 C) (05/14 0000) Pulse Rate:  [47-102] 78  (05/14 0800) Resp:  [10-33] 23  (05/14 0800) BP: (78-153)/(31-96) 99/60 mmHg (05/14 0800) SpO2:  [73 %-100 %] 98 % (05/14 0800) Weight:  [200 lb (90.719 kg)-239 lb 3.2 oz (108.5 kg)] 239 lb 3.2 oz (108.5 kg) (05/14 0015) I/O last 3 completed shifts: In: 14907.7 [I.V.:13047.7; IV Piggyback:1860] Out: 4545 [Urine:4545]  Physical Examination: General:  WNWDWM more awake Neuro:  Awake and follows commands.  HEENT:  Short neck Cardiovascular:  hsr rrr Lungs:  Decreased bs bases Abdomen:  +bs Musculoskeletal:  intact Skin:  intact  Ventilator settings:    Labs and Imaging:  Dg Chest 2 View  11/29/2011  *RADIOLOGY REPORT*  Clinical Data: Weakness, nausea, vomiting, seizure-like activity this morning  CHEST - 1 VIEW  Comparison:  08/21/2003  Findings:  Normal cardiac silhouette and mediastinal contours.  Lung volumes remain persistently reduced with perihilar heterogeneous opacities. There is persistent mild elevation of the right hemidiaphragm.   No focal airspace opacities.  No pleural effusion or pneumothorax. Spinal stimulator overlies the lower thoracic spine.  Post cholecystectomy.  IMPRESSION: Decreased lung volumes without acute cardiopulmonary disease.  Original Report Authenticated By: Waynard Reeds, M.D.   Ct Head Wo Contrast  11/29/2011  *RADIOLOGY REPORT*  Clinical Data:  Pain.  Confusion.  Fall episodes.  CT HEAD WITHOUT CONTRAST  Technique: Contiguous axial images were obtained from the base of the skull through the vertex without intravenous contrast.  Comparison:   None.  Findings:  No mass effect, midline shift, or acute intracranial hemorrhage.  Mastoid air cells are clear.  Near complete opacification of the anterior ethmoid air cells and right frontal sinus is noted.  No obvious acute bony deformity.  No cranial fracture.  Mild global atrophy appropriate to age.  IMPRESSION:  No acute intracranial pathology.  Chronic-appearing inflammatory changes in the paranasal sinuses.  Jolaine Click, M.D.  Original Report Authenticated By: Donavan Burnet, M.D.   Ct Lumbar Spine Wo Contrast  11/29/2011  *RADIOLOGY REPORT*  Clinical Data: Fall.  Pain.  Altered mental status.  Previous surgery.  CT LUMBAR SPINE WITHOUT CONTRAST  Technique:  Multidetector CT imaging of the lumbar spine was performed without intravenous contrast administration. Multiplanar CT image reconstructions were also generated.  Comparison: MRI 12/25/2008.  CT 06/26/2008.  Findings: T12-L1:  Unremarkable interspace.  Dorsal neurostimulator extends into the thoracic region.  L1-2:  Disc degeneration with mild disc space narrowing and circumferential bulging of the disc.  No compressive stenosis.  L2-3:  Retrolisthesis of 3 mm.  Disc degeneration with vacuum phenomenon and circumferential protrusion of disc material. Moderate stenosis at this level.  L3-3 sacrum:  Previous fusion with pedicle screws and posterior rods.  Fusion appears solid.  No evidence of screw loosening or  motion.  Canal and foramina appear sufficiently patent.  Sacroiliac joints show degenerative arthritis bilaterally.  IMPRESSION: Solid fusion from L3 to the sacrum.  Sufficient patency of the canal and foramina in that region.  Adjacent segment degenerative disease at L2-3.  Retrolisthesis of 3 mm.  Degeneration of the disc with vacuum phenomenon and circumferential protrusion.  Moderate stenosis at this level that could be symptomatic.  Original Report Authenticated By: Thomasenia Sales, M.D.   US Renal  11/29/2011  *RADIOLOGY REPORT*  Clinical Data: Elevated creatinine.  RENAL/URINARY TRACT ULTRASOUND COMPLETE  Comparison:  CT 06/26/2008.  Findings:  Right Kidney:  11.8 cm. Normal size and echotexture.  No focal abnormality.  No hydronephrosis.  Left Kidney:  12.6 cm. Normal size and echotexture.  No focal abnormality.  No hydronephrosis.  Bladder:  Bladder decompressed and not visualized.  IMPRESSION: No acute renal abnormality.  No hydronephrosis.  Original Report Authenticated By: Cyndie Chime, M.D.   Portable Chest Xray In Am  11/30/2011  *RADIOLOGY REPORT*  Clinical Data: Low lung volumes with weakness  PORTABLE CHEST - 1 VIEW  Comparison:  11/29/2011  Findings:  Continued worsening aeration with decreasing lung volumes and increasing bibasilar opacities.  Edema versus early infiltrates versus subsegmental atelectasis. There is borderline cardiac enlargement.  IMPRESSION: Continued worsening aeration.  Original Report Authenticated By: Elsie Stain, M.D.    Lab 11/30/11 0545 11/29/11 1427 11/29/11 0910  NA 137 133* 131*  K 5.3* 5.3* 5.1  CL 109 99 90*  CO2 20 22 23   BUN 33* 54* 56*  CREATININE 2.07* 4.90* 5.95*  GLUCOSE 113* 146* 152*    Lab 11/29/11 0910  HGB 13.1  HCT 40.8  WBC 10.9*  PLT 170   ABG    Component Value Date/Time   PHART 7.238* 11/30/2011 0452    Assessment and Plan: Shock from presumed sepsis ? Urinary source complicated by chronic narcotics. 5/14  improved -admit to icu -sepsis protocol -empirical abx -cortisol level 5/13, 22.  Renal failure in setting of ACE-I, presumed sepsis -dc ace-i -fluid resuscitation -place cvl to guide fluids if needed. He refuses -renal US neg  Chronic Pain -dc narcotics till awake -narcan wakes him up,  Drip dc'd 5/14  Recent fall from ladder with injury to back -check lumbar films(neg)  Best practices / Disposition: -->SDU status under PCCM 5/14 if improves will tx to floor and to triad 5/15 -->full code -->Heparin for DVT Px -->Protonix for GI Px -->diet advance 5/14 -->family updated at bedside  [facility] ACNP Adolph Pollack PCCM Pager [phone] till 3 pm If no answer page [phone] 11/30/2011, 9:00 AM  Will hold in STU overnight then if BP becomes more stable then will transfer to tele in AM. Insulin protocol. OOB to chair. D/C scheduled bronchodilators and  change to PRN. CBG checks. Diabetic educator. Change vanc/zosyn to cipro.  Patient seen and examined, agree with above note.  I dictated the care and orders written for this patient under my direction.  Koren Bound, M.D. [phone]

## 2011-11-30 NOTE — Progress Notes (Signed)
MD called about pt's low BP despite 1L fluid bolus. Dr. Marchelle Gearing to order another 1L bolus. Will continue to monitor patient and BP

## 2011-11-30 NOTE — Plan of Care (Signed)
Problem: Phase I Progression Outcomes Goal: OOB as tolerated unless otherwise ordered Outcome: Not Progressing Patient remains too lethargic to ambulate Goal: Initial discharge plan identified Outcome: Not Met (add Reason) Foley catheter in place Goal: Hemodynamically stable Outcome: Not Progressing Low BP- patient requiring bolus doses of NS to maintain BP

## 2011-12-01 ENCOUNTER — Inpatient Hospital Stay (HOSPITAL_COMMUNITY): Payer: Medicare Other

## 2011-12-01 DIAGNOSIS — J811 Chronic pulmonary edema: Secondary | ICD-10-CM

## 2011-12-01 DIAGNOSIS — J9600 Acute respiratory failure, unspecified whether with hypoxia or hypercapnia: Secondary | ICD-10-CM | POA: Diagnosis present

## 2011-12-01 LAB — GLUCOSE, CAPILLARY
Glucose-Capillary: 112 mg/dL — ABNORMAL HIGH (ref 70–99)
Glucose-Capillary: 157 mg/dL — ABNORMAL HIGH (ref 70–99)
Glucose-Capillary: 92 mg/dL (ref 70–99)
Glucose-Capillary: 97 mg/dL (ref 70–99)

## 2011-12-01 LAB — BLOOD GAS, ARTERIAL
Acid-base deficit: 4.1 mmol/L — ABNORMAL HIGH (ref 0.0–2.0)
Bicarbonate: 21.6 mEq/L (ref 20.0–24.0)
Drawn by: 129801
MECHVT: 500 mL
pH, Arterial: 7.307 — ABNORMAL LOW (ref 7.350–7.450)
pO2, Arterial: 72.6 mmHg — ABNORMAL LOW (ref 80.0–100.0)

## 2011-12-01 LAB — CARDIAC PANEL(CRET KIN+CKTOT+MB+TROPI)
CK, MB: 37 ng/mL (ref 0.3–4.0)
Total CK: 2226 U/L — ABNORMAL HIGH (ref 7–232)
Total CK: 1335 U/L — ABNORMAL HIGH (ref 7–232)

## 2011-12-01 LAB — CBC
MCH: 30 pg (ref 26.0–34.0)
MCHC: 32 g/dL (ref 30.0–36.0)
RDW: 13.7 % (ref 11.5–15.5)

## 2011-12-01 LAB — PHOSPHORUS: Phosphorus: 1.8 mg/dL — ABNORMAL LOW (ref 2.3–4.6)

## 2011-12-01 LAB — BASIC METABOLIC PANEL
BUN: 14 mg/dL (ref 6–23)
Calcium: 8.2 mg/dL — ABNORMAL LOW (ref 8.4–10.5)
GFR calc Af Amer: >90 mL/min (ref >90)
GFR calc non Af Amer: 89 mL/min — ABNORMAL LOW (ref >90)
Potassium: 4.6 mEq/L (ref 3.5–5.1)
Sodium: 137 mEq/L (ref 135–145)

## 2011-12-01 MED ORDER — ETOMIDATE 2 MG/ML IV SOLN
INTRAVENOUS | Status: AC
  Administered 2011-12-01: 20 mg
  Filled 2011-12-01: qty 20

## 2011-12-01 MED ORDER — SODIUM CHLORIDE 0.9 % IV SOLN
INTRAVENOUS | Status: DC
  Administered 2011-12-01: 13:00:00 via INTRAVENOUS

## 2011-12-01 MED ORDER — MIDAZOLAM HCL 5 MG/ML IJ SOLN
INTRAMUSCULAR | Status: AC
  Administered 2011-12-01: 2 mg
  Filled 2011-12-01: qty 2

## 2011-12-01 MED ORDER — SODIUM CHLORIDE 0.9 % IV SOLN
0.2000 ug/kg/h | INTRAVENOUS | Status: DC
  Administered 2011-12-01: 0.7 ug/kg/h via INTRAVENOUS
  Filled 2011-12-01: qty 2

## 2011-12-01 MED ORDER — JEVITY 1.2 CAL PO LIQD
1000.0000 mL | ORAL | Status: DC
  Administered 2011-12-01 – 2011-12-02 (×2): 1000 mL
  Administered 2011-12-03: 05:00:00
  Administered 2011-12-05 – 2011-12-06 (×2): 1000 mL

## 2011-12-01 MED ORDER — PANTOPRAZOLE SODIUM 40 MG PO PACK
40.0000 mg | PACK | Freq: Every day | ORAL | Status: DC
  Administered 2011-12-01 – 2011-12-05 (×5): 40 mg
  Filled 2011-12-01 (×8): qty 20

## 2011-12-01 MED ORDER — PROPOFOL 10 MG/ML IV EMUL
5.0000 ug/kg/min | INTRAVENOUS | Status: DC
  Administered 2011-12-01: 20 ug/kg/min via INTRAVENOUS
  Administered 2011-12-01: 40 ug/kg/min via INTRAVENOUS
  Administered 2011-12-01 – 2011-12-02 (×2): 50 ug/kg/min via INTRAVENOUS
  Administered 2011-12-02 (×2): 35 ug/kg/min via INTRAVENOUS
  Administered 2011-12-02 (×2): 50 ug/kg/min via INTRAVENOUS
  Administered 2011-12-02 (×2): 35 ug/kg/min via INTRAVENOUS
  Administered 2011-12-03 (×2): 30 ug/kg/min via INTRAVENOUS
  Administered 2011-12-03: 35 ug/kg/min via INTRAVENOUS
  Administered 2011-12-03 – 2011-12-05 (×10): 30 ug/kg/min via INTRAVENOUS
  Administered 2011-12-06: 15 ug/kg/min via INTRAVENOUS
  Administered 2011-12-06: 30 ug/kg/min via INTRAVENOUS
  Filled 2011-12-01 (×24): qty 100

## 2011-12-01 MED ORDER — HALOPERIDOL LACTATE 5 MG/ML IJ SOLN
INTRAMUSCULAR | Status: AC
  Filled 2011-12-01: qty 1

## 2011-12-01 MED ORDER — SODIUM CHLORIDE 0.9 % IV SOLN
0.2000 ug/kg/h | INTRAVENOUS | Status: DC
  Administered 2011-12-01: 1 ug/kg/h via INTRAVENOUS
  Administered 2011-12-01: 0.7 ug/kg/h via INTRAVENOUS
  Filled 2011-12-01 (×2): qty 4

## 2011-12-01 MED ORDER — ROCURONIUM BROMIDE 50 MG/5ML IV SOLN
INTRAVENOUS | Status: AC
  Administered 2011-12-01: 8 mg
  Filled 2011-12-01: qty 2

## 2011-12-01 MED ORDER — FENTANYL 50 MCG/HR TD PT72: 50.0000 ug | MEDICATED_PATCH | TRANSDERMAL | Status: DC

## 2011-12-01 MED ORDER — LORAZEPAM 2 MG/ML IJ SOLN
2.0000 mg | Freq: Once | INTRAMUSCULAR | Status: AC
  Administered 2011-12-01: 2 mg via INTRAVENOUS

## 2011-12-01 MED ORDER — FENTANYL CITRATE 0.05 MG/ML IJ SOLN
25.0000 ug | INTRAMUSCULAR | Status: DC | PRN
  Administered 2011-12-01: 25 ug via INTRAVENOUS

## 2011-12-01 MED ORDER — HALOPERIDOL LACTATE 5 MG/ML IJ SOLN
5.0000 mg | Freq: Once | INTRAMUSCULAR | Status: AC
  Administered 2011-12-01: 3 mg via INTRAVENOUS

## 2011-12-01 MED ORDER — MAGNESIUM SULFATE IN D5W 10-5 MG/ML-% IV SOLN
1.0000 g | Freq: Once | INTRAVENOUS | Status: AC
  Administered 2011-12-01: 1 g via INTRAVENOUS
  Filled 2011-12-01: qty 100

## 2011-12-01 MED ORDER — FENTANYL BOLUS VIA INFUSION
50.0000 ug | Freq: Four times a day (QID) | INTRAVENOUS | Status: DC | PRN
  Filled 2011-12-01: qty 100

## 2011-12-01 MED ORDER — LORAZEPAM 2 MG/ML IJ SOLN
1.0000 mg | INTRAMUSCULAR | Status: DC | PRN
  Administered 2011-12-01: 2 mg via INTRAVENOUS
  Filled 2011-12-01: qty 1

## 2011-12-01 MED ORDER — LORAZEPAM 2 MG/ML IJ SOLN
INTRAMUSCULAR | Status: AC
  Administered 2011-12-01: 02:00:00
  Filled 2011-12-01: qty 1

## 2011-12-01 MED ORDER — ATROPINE SULFATE 0.1 MG/ML IJ SOLN
INTRAMUSCULAR | Status: AC
  Filled 2011-12-01: qty 10

## 2011-12-01 MED ORDER — MAGNESIUM SULFATE 50 % IJ SOLN: 1.0000 g | Freq: Once | INTRAVENOUS | Status: DC

## 2011-12-01 MED ORDER — SUCCINYLCHOLINE CHLORIDE 20 MG/ML IJ SOLN
INTRAMUSCULAR | Status: AC
  Filled 2011-12-01: qty 10

## 2011-12-01 MED ORDER — PROPOFOL 10 MG/ML IV EMUL
INTRAVENOUS | Status: AC
  Filled 2011-12-01: qty 100

## 2011-12-01 MED ORDER — FENTANYL CITRATE 0.05 MG/ML IJ SOLN
25.0000 ug | Freq: Once | INTRAMUSCULAR | Status: AC
  Administered 2011-12-01: 100 ug via INTRAVENOUS
  Filled 2011-12-01: qty 4

## 2011-12-01 MED ORDER — SODIUM CHLORIDE 0.9 % IV SOLN
INTRAVENOUS | Status: DC
  Administered 2011-12-01 – 2011-12-02 (×2): 20 mL/h via INTRAVENOUS
  Administered 2011-12-02 – 2011-12-03 (×2): via INTRAVENOUS
  Administered 2011-12-04: 20 mL/h via INTRAVENOUS
  Administered 2011-12-08: 21:00:00 via INTRAVENOUS

## 2011-12-01 MED ORDER — SODIUM CHLORIDE 0.9 % IV SOLN
50.0000 ug/h | INTRAVENOUS | Status: DC
  Administered 2011-12-01: 50 ug/h via INTRAVENOUS
  Administered 2011-12-01 – 2011-12-02 (×3): 75 ug/h via INTRAVENOUS
  Administered 2011-12-03 – 2011-12-04 (×2): 100 ug/h via INTRAVENOUS
  Filled 2011-12-01 (×4): qty 50

## 2011-12-01 MED ORDER — POTASSIUM PHOSPHATE DIBASIC 3 MMOLE/ML IV SOLN
20.0000 mmol | Freq: Once | INTRAVENOUS | Status: AC
  Administered 2011-12-01: 20 mmol via INTRAVENOUS
  Filled 2011-12-01: qty 6.67

## 2011-12-01 MED ORDER — LIDOCAINE HCL (CARDIAC) 20 MG/ML IV SOLN
INTRAVENOUS | Status: AC
  Filled 2011-12-01: qty 5

## 2011-12-01 MED ORDER — FENTANYL CITRATE 0.05 MG/ML IJ SOLN
INTRAMUSCULAR | Status: AC
  Administered 2011-12-01: 100 ug
  Filled 2011-12-01: qty 2

## 2011-12-01 MED ORDER — PRO-STAT SUGAR FREE PO LIQD
30.0000 mL | Freq: Four times a day (QID) | ORAL | Status: DC
  Administered 2011-12-01 – 2011-12-05 (×17): 30 mL
  Filled 2011-12-01 (×23): qty 30

## 2011-12-01 MED ORDER — MIDAZOLAM HCL 5 MG/ML IJ SOLN
1.0000 mg | INTRAMUSCULAR | Status: DC | PRN
  Administered 2011-12-01: 2 mg via INTRAVENOUS
  Administered 2011-12-01: 5 mg via INTRAVENOUS
  Filled 2011-12-01 (×2): qty 1

## 2011-12-01 MED ORDER — HALOPERIDOL LACTATE 5 MG/ML IJ SOLN
5.0000 mg | INTRAMUSCULAR | Status: AC
  Administered 2011-12-01: 5 mg via INTRAVENOUS

## 2011-12-01 MED ORDER — FENTANYL CITRATE 0.05 MG/ML IJ SOLN
25.0000 ug | INTRAMUSCULAR | Status: DC | PRN
  Administered 2011-12-01: 75 ug via INTRAVENOUS
  Administered 2011-12-01 (×2): 100 ug via INTRAVENOUS
  Filled 2011-12-01 (×3): qty 2

## 2011-12-01 MED ORDER — FUROSEMIDE 10 MG/ML IJ SOLN
40.0000 mg | Freq: Four times a day (QID) | INTRAMUSCULAR | Status: AC
Start: 2011-12-01 — End: 2011-12-02
  Administered 2011-12-01 – 2011-12-02 (×3): 40 mg via INTRAVENOUS
  Filled 2011-12-01 (×3): qty 4

## 2011-12-01 MED ORDER — BIOTENE DRY MOUTH MT LIQD
15.0000 mL | Freq: Four times a day (QID) | OROMUCOSAL | Status: DC
  Administered 2011-12-02 – 2011-12-07 (×21): 15 mL via OROMUCOSAL

## 2011-12-01 MED ORDER — CHLORHEXIDINE GLUCONATE 0.12 % MT SOLN
15.0000 mL | Freq: Two times a day (BID) | OROMUCOSAL | Status: DC
  Administered 2011-12-01 – 2011-12-07 (×11): 15 mL via OROMUCOSAL
  Filled 2011-12-01 (×14): qty 15

## 2011-12-01 NOTE — Progress Notes (Signed)
INITIAL ADULT NUTRITION ASSESSMENT Date: 12/01/2011   Time: 1:02 PM Reason for Assessment: Consult for TF initiation and management  ASSESSMENT: Male 66 y.o.  Dx: Sepsis  Hx:  Past Medical History  Diagnosis Date  . Hepatitis C   . Diabetes mellitus   . Hypertension   . Neuropathy     feet  . Bronchitis, chronic     Related Meds:  Scheduled Meds:   . antiseptic oral rinse  15 mL Mouth Rinse q12n4p  . atropine      . chlorhexidine  15 mL Mouth Rinse BID  . ciprofloxacin  500 mg Oral BID  . etomidate      . fentaNYL  50 mcg Transdermal Q72H  . fentaNYL      . fentaNYL  25 mcg Intravenous Once  . furosemide  40 mg Intravenous Q6H  . haloperidol lactate      . haloperidol lactate  5 mg Intravenous Once  . haloperidol lactate  5 mg Intravenous STAT  . lidocaine (cardiac) 100 mg/73ml      . LORazepam      . LORazepam  2 mg Intravenous Once  . magnesium sulfate 1 - 4 g bolus IVPB  1 g Intravenous Once  . midazolam      . naloxone (NARCAN) injection  0.4 mg Intravenous Once  . pantoprazole sodium  40 mg Per Tube QHS  . potassium phosphate IVPB (mmol)  20 mmol Intravenous Once  . rocuronium      . sodium polystyrene  15 g Oral Once  . succinylcholine      . DISCONTD: magnesium sulfate LVP 250-500 ml  1 g Intravenous Once  . DISCONTD: pantoprazole  40 mg Oral QHS  . DISCONTD: pantoprazole (PROTONIX) IV  40 mg Intravenous QHS   Continuous Infusions:   . sodium chloride 75 mL/hr at 12/01/11 1256  . DISCONTD: sodium chloride 150 mL/hr at 11/30/11 1824  . DISCONTD: sodium chloride 100 mL/hr at 11/30/11 0517  . DISCONTD: dexmedetomidine (PRECEDEX) IV infusion 0.5 mcg/kg/hr (12/01/11 0311)  . DISCONTD: dexmedetomidine (PRECEDEX) IV infusion for high rates Stopped (12/01/11 1212)   PRN Meds:.acetaminophen, albuterol, fentaNYL, insulin aspart, LORazepam, methocarbamol, midazolam, ondansetron, traMADol, DISCONTD: fentaNYL, DISCONTD: haloperidol lactate, DISCONTD:  oxyCODONE-acetaminophen   Ht: 5\' 9"  (175.3 cm)  Wt: 239 lb 3.2 oz (108.5 kg)  Ideal Wt: 72.72 kg % Ideal Wt: 149.3%  Wt Readings from Last 10 Encounters:  11/30/11 239 lb 3.2 oz (108.5 kg)    Usual Wt: unknown, no family present at bedside.   Body mass index is 35.32 kg/(m^2). (Obesity class II)  Food/Nutrition Related Hx: Patient is intubated on mechanical ventilation with OG tube in place.   Labs:  CMP     Component Value Date/Time   NA 137 12/01/2011 0520   K 4.6 12/01/2011 0520   CL 108 12/01/2011 0520   CO2 22 12/01/2011 0520   GLUCOSE 149* 12/01/2011 0520   BUN 14 12/01/2011 0520   CREATININE 0.85 12/01/2011 0520   CALCIUM 8.2* 12/01/2011 0520   PROT 6.9 11/29/2011 1427   ALBUMIN 2.8* 11/29/2011 1427   AST 121* 11/29/2011 1427   ALT 39 11/29/2011 1427   ALKPHOS 95 11/29/2011 1427   BILITOT 0.5 11/29/2011 1427   GFRNONAA 89* 12/01/2011 0520   GFRAA >90 12/01/2011 0520    Intake/Output Summary (Last 24 hours) at 12/01/11 1305 Last data filed at 12/01/11 1200  Gross per 24 hour  Intake 2197.7 ml  Output   2101  ml  Net   96.7 ml     Diet Order:  Patient to start TF   Supplements/Tube Feeding: none at this time  IVF:    sodium chloride Last Rate: 75 mL/hr at 12/01/11 1256  DISCONTD: sodium chloride Last Rate: 150 mL/hr at 11/30/11 1824  DISCONTD: sodium chloride Last Rate: 100 mL/hr at 11/30/11 1610  DISCONTD: dexmedetomidine (PRECEDEX) IV infusion Last Rate: 0.5 mcg/kg/hr (12/01/11 0311)  DISCONTD: dexmedetomidine (PRECEDEX) IV infusion for high rates Last Rate: Stopped (12/01/11 1212)    Estimated Nutritional Needs:   Kcal: 9604-5409 Protein: 130-163 grams Fluid: 1 ml per kcal intake  Enteral nutrition to provide 60-70% of estimated calorie needs (22-25 kcals/kg ideal body weight) and 100% of estimated protein needs, based on ASPEN guidelines for permissive underfeeding in critically ill obese individuals.   NUTRITION DIAGNOSIS: -Inadequate oral intake  (NI-2.1).  Status: Ongoing  RELATED TO: inability to eat  AS EVIDENCE BY: NPO status  MONITORING/EVALUATION(Goals): Diet advancement, weight trends, labs, I/O's 1. Initiation of TF with positive tolerance. 2. Meet > 90% of estimated energy needs with nutrition support.   EDUCATION NEEDS: -No education needs identified at this time  INTERVENTION: 1. Initiate TF of Jevity 1.2 @ 20 ml/hr and increase by 10 every 4 hours to a goal rate of 45 ml/hr (provides 1080 ml, 1296 kcal, 60g protein, and 874.8 ml free water). Plus Prostat 4x's daily. Total enteral nutrition to provide 1696 kcal (meets 100% of estimated energy needs based on ASPEN guidelines) and 120 grams of protein (meets 92.26% of estimated protein needs).  2. RD to follow for nutrition plan of care.   Dietitian [phone]  DOCUMENTATION CODES Per approved criteria  -Obesity Unspecified    Iven Finn [facility] 12/01/2011, 1:02 PM

## 2011-12-01 NOTE — Procedures (Signed)
Intubation Procedure Note Xavier White [id number] 09-Sep-1945  Procedure: Intubation Indications: Airway protection and maintenance  Procedure Details Consent: Risks of procedure as well as the alternatives and risks of each were explained to the (patient/caregiver).  Consent for procedure obtained. Time Out: Verified patient identification, verified procedure, site/side was marked, verified correct patient position, special equipment/implants available, medications/allergies/relevent history reviewed, required imaging and test results available.  Performed  3 Medications: IV  Fentanyl 100 mcg Etomidate 20 mg Versed 2 mg NMB Norcuron 8 mg   Evaluation Hemodynamic Status: BP stable throughout; O2 sats: stable throughout Patient's Current Condition: stable Complications: No apparent complications Patient did tolerate procedure well. Chest X-ray ordered to verify placement.  CXR: pending.   Xavier White ACNP Xavier White PCCM Pager [phone] till 3 pm If no answer page [phone] 12/01/2011, 9:55 AM  Patient seen and examined, agree with above note.  I dictated the care and orders written for this patient under my direction.  Xavier White, M.D. [phone]

## 2011-12-01 NOTE — Procedures (Signed)
Central Venous Catheter Insertion Procedure Note ANGELICA WIX [id number] [birth year]  Procedure: Insertion of Central Venous Catheter Indications: Assessment of intravascular volume  Procedure Details Consent: Risks of procedure as well as the alternatives and risks of each were explained to the (patient/caregiver).  Consent for procedure obtained. Time Out: Verified patient identification, verified procedure, site/side was marked, verified correct patient position, special equipment/implants available, medications/allergies/relevent history reviewed, required imaging and test results available.  Performed  Maximum sterile technique was used including antiseptics, cap, gloves, gown, hand hygiene, mask and sheet. Skin prep: Chlorhexidine; local anesthetic administered A antimicrobial bonded/coated triple lumen catheter was placed in the left internal jugular vein using the Seldinger technique. Ultrasound guidance used.yes Catheter placed to 20 cm. Blood aspirated via all 3 ports and then flushed x 3. Line sutured x 2 and dressing applied.  Evaluation Blood flow good Complications: No apparent complications Patient did tolerate procedure well. Chest X-ray ordered to verify placement.  CXR: pending.  Brett Canales Minor ACNP Adolph Pollack PCCM Pager [phone] till 3 pm If no answer page [phone] 12/01/2011, 9:57 AM  I was present for procedure, done under my direction, U/S used in placement.  Patient seen and examined, agree with above note.  I dictated the care and orders written for this patient under my direction.  Koren Bound, M.D. [phone]

## 2011-12-01 NOTE — Progress Notes (Signed)
Name: Xavier White MRN: [id number] DOB: Aug 10, 1945    LOS: 2 Requesting EX:[id number] Regarding: Shock/hypoxia  PCCM NOTE  History of Present Illness: 66 yo wm with chronic back pain and treated at local pain clinic. Family reports 24 hours of somnolence, confusion and low grade fever. Presented to [facility] ED 5/13 with 85/65 bp. Tx with narcan with abrupt arousal and complaints of pain. Creatine >6(on ace-i) Urine appeared dark and PCCM asked to admit.  Lines / Drains: ETT 5/15>>> L IJ TLC 5/15>>>  Cultures: 5/13 bc x 2>>NTD 5/13 uc>>NTD  Antibiotics: 5/13 vanc>>5/14 5/13 zoysn>>5/14 5/14 Cipro>>>  Tests / Events: 5/13 somulent and hypotensive 5/14 wide awake 5/15 severe withdrawal and agitation then developed respiratory failure.  Subjective: Vital Signs: Temp:  [97.6 F (36.4 C)-99.1 F (37.3 C)] 97.8 F (36.6 C) (05/15 0800) Pulse Rate:  [41-80] 41  (05/15 0500) Resp:  [15-26] 21  (05/15 0500) BP: (83-120)/(40-73) 114/62 mmHg (05/15 0500) SpO2:  [95 %-99 %] 98 % (05/15 0500) FiO2 (%):  [40 %] 40 % (05/15 0942) I/O last 3 completed shifts: In: 20553.8 [P.O.:320; I.V.:16873.8; IV Piggyback:3360] Out: 5171 [Urine:5170; Stool:1]   Intake/Output Summary (Last 24 hours) at 12/01/11 1106 Last data filed at 12/01/11 0600  Gross per 24 hour  Intake 2697.7 ml  Output   1451 ml  Net 1246.7 ml   Physical Examination: General:  WNWDWM more awake Neuro:  Awake and follows commands.  HEENT:  Short neck Cardiovascular:  hsr rrr Lungs:  Decreased bs bases Abdomen:  +bs Musculoskeletal:  intact Skin:  intact  Ventilator settings: Vent Mode:  [-] PRVC FiO2 (%):  [40 %] 40 % Set Rate:  [18 bmp] 18 bmp Vt Set:  [500 mL] 500 mL PEEP:  [5 cmH20] 5 cmH20  Labs and Imaging:  Ct Lumbar Spine Wo Contrast  11/29/2011   *RADIOLOGY REPORT*  Clinical Data: Fall.  Pain.  Altered mental status.  Previous surgery.  CT LUMBAR SPINE WITHOUT CONTRAST  Technique:  Multidetector CT imaging of the lumbar spine was performed without intravenous contrast administration. Multiplanar CT image reconstructions were also generated.  Comparison: MRI 12/25/2008.  CT 06/26/2008.  Findings: T12-L1:  Unremarkable interspace.  Dorsal neurostimulator extends into the thoracic region.  L1-2:  Disc degeneration with mild disc space narrowing and circumferential bulging of the disc.  No compressive stenosis.  L2-3:  Retrolisthesis of 3 mm.  Disc degeneration with vacuum phenomenon and circumferential protrusion of disc material. Moderate stenosis at this level.  L3-3 sacrum:  Previous fusion with pedicle screws and posterior rods.  Fusion appears solid.  No evidence of screw loosening or motion.  Canal and foramina appear sufficiently patent.  Sacroiliac joints show degenerative arthritis bilaterally.  IMPRESSION: Solid fusion from L3 to the sacrum.  Sufficient patency of the canal and foramina in that region.  Adjacent segment degenerative disease  at L2-3.  Retrolisthesis of 3 mm.  Degeneration of the disc with vacuum phenomenon and circumferential protrusion.  Moderate stenosis at this level that could be symptomatic.  Original Report Authenticated By: Thomasenia Sales, M.D.   US Renal  11/29/2011  *RADIOLOGY REPORT*  Clinical Data: Elevated creatinine.  RENAL/URINARY TRACT ULTRASOUND COMPLETE  Comparison:  CT 06/26/2008.  Findings:  Right Kidney:  11.8 cm. Normal size and echotexture.  No focal abnormality.  No hydronephrosis.  Left Kidney:  12.6 cm. Normal size and echotexture.  No focal abnormality.  No hydronephrosis.  Bladder:  Bladder decompressed and not visualized.  IMPRESSION: No acute renal abnormality.  No hydronephrosis.  Original Report Authenticated By: Cyndie Chime, M.D.   Dg Chest Port 1 View  12/01/2011  *RADIOLOGY REPORT*  Clinical  Data: Sepsis  PORTABLE CHEST - 1 VIEW  Comparison: 11/30/2011; 11/29/2011; 08/21/2003  Findings:  Grossly unchanged cardiac silhouette and mediastinal contours given persistently reduced lung volumes.  Examination is degraded secondary exclusion of the right lung apex.  There is mild pulmonary venous congestion without frank evidence of pulmonary edema.  Perihilar heterogeneous opacities, right greater than left. Persistent mild elevation of the right hemidiaphragm.  No definite pneumothorax.  Grossly unchanged bones.  Spinal stimulator overlies the lower thoracic spine.  IMPRESSION: 1.  Mild pulmonary venous congestion without frank evidence of pulmonary edema. 2.  Persistently reduced lung volumes with perihilar opacities, right greater than left, possibly atelectasis.  Original Report Authenticated By: Waynard Reeds, M.D.   Portable Chest Xray In Am  11/30/2011  *RADIOLOGY REPORT*  Clinical Data: Low lung volumes with weakness  PORTABLE CHEST - 1 VIEW  Comparison:  11/29/2011  Findings:  Continued worsening aeration with decreasing lung volumes and increasing bibasilar opacities.  Edema versus early infiltrates versus subsegmental atelectasis. There is borderline cardiac enlargement.  IMPRESSION: Continued worsening aeration.  Original Report Authenticated By: Elsie Stain, M.D.    Lab 12/01/11 0520 11/30/11 0545 11/29/11 1427  NA 137 137 133*  K 4.6 5.3* 5.3*  CL 108 109 99  CO2 22 20 22   BUN 14 33* 54*  CREATININE 0.85 2.07* 4.90*  GLUCOSE 149* 113* 146*    Lab 12/01/11 0520 11/29/11 0910  HGB 10.8* 13.1  HCT 33.8* 40.8  WBC 5.0 10.9*  PLT 99* 170   ABG    Component Value Date/Time   PHART 7.238* 11/30/2011 0452    Assessment and Plan: Shock from presumed sepsis ? Urinary source complicated by chronic narcotics. 5/14 improved. 5/15 resolved.  Now agitated and hypertensive. - Sepsis protocol complete, D/C IVF. - Abx changed to cipro alone for urinary source, CXR clear. -  Cortisol level 5/13, 22, no need for replacement.  VDRF secondary to agitation/ams/opoid withdrawal A. ETT placed 5/15 for airway control P.  - Vent till stable. - Check sputum cul. - Sedation protocol.   Renal failure in setting of ACE-I, presumed sepsis. Resolved 5/15 Lab Results  Component Value Date   CREATININE 0.85 12/01/2011   CREATININE 2.07* 11/30/2011   CREATININE 4.90* 11/29/2011  - D/C ace-i - KVO IVF. - Low dose lasix. - Place cvl to guide fluids if needed. CVL 5/15 - Renal US neg  Chronic Pain - D/C narcotics till awake - PRN fentanyl. - Start fentanyl patch 5/15 along with precedex drip .  Recent fall from ladder with injury to back - Lumbar films(neg)  Best practices / Disposition: -->ICU status under PCCM 5/14  -->full code -->Heparin for  DVT Px -->Protonix for GI Px -->diet advance 5/14, npo 5/15. Place OGT 5/15 -->family updated at bedside  [facility] Minor ACNP Adolph Pollack PCCM Pager [phone] till 3 pm If no answer page [phone] 12/01/2011, 10:00 AM  CC time 45 min.  Intubate today due to shallow breathing and fluid overload.  Maintain on full vent support.  PRN fentanyl and a fentanyl patch.  Will diurese.  Patient seen and examined, agree with above note.  I dictated the care and orders written for this patient under my direction.  Koren Bound, M.D. [phone]

## 2011-12-01 NOTE — Progress Notes (Signed)
eLink Physician-Brief Progress Note Patient Name: Xavier White DOB: 10-14-1945 MRN: [id number]  Date of Service  12/01/2011   HPI/Events of Note   Lab 12/01/11 0520 11/30/11 0545 11/29/11 1427 11/29/11 0910  NA 137 137 133* 131*  K 4.6 5.3* -- --  CL 108 109 99 90*  CO2 22 20 22 23   GLUCOSE 149* 113* 146* 152*  BUN 14 33* 54* 56*  CREATININE 0.85 2.07* 4.90* 5.95*  CALCIUM 8.2* 6.9* 7.7* 8.9  MG 1.6 -- 2.2 --  PHOS 1.8* -- 5.6* --      eICU Interventions  Replete with 1gm mag and of k phos   Intervention Category Major Interventions: Electrolyte abnormality - evaluation and management  Nori Winegar 12/01/2011, 6:47 AM

## 2011-12-01 NOTE — Progress Notes (Signed)
Severely agitated attempting to climb out of bed, pulling monitor off and shaking, cursing, and flailing arms at staff, attempting to walk naked in hallway. 4 Security officers held patient, bilateral wrist restraints and protective belt applied to keep pt safe. Acute s/o withdrawal noted, haldol iv, ativan iv, fentanyl iv, and precedex drip started. States taking oxycontin and oxycodone for the past 6 months at home. Safety sitter at bedside to protect pt due to confusion and severe agitation. Close communication with intensive care MD maintained.Mag 1 gram IV and KPhos 20 mmol IV replacement given for  Mag 1.6, Phos 1.8.

## 2011-12-01 NOTE — Progress Notes (Addendum)
eLink Physician-Brief Progress Note Patient Name: Xavier White DOB: 08/19/1945 MRN: [id number]  Date of Service  12/01/2011   HPI/Events of Note   RN calling elink   - patient restless c/o inability to lie flat due to dyspnea which RN thinks is manifestation of anxiety. No crackles and vitals ok. Patient feeling the lack of chronic opioids in system. RN thinks patient mildly delirious  eICU Interventions  At 0:41h - Haldol 5mg  IV stat; if no response will load repeatedly x 3-4 doses in total and if still no response will try pain medication  Might need inpatient pain/pall care cx for pain  Addendum 1:08 AM  - haldol had no effect. RN describing symptoms c/w opioid withdrawal - restless, irritable, fidgety, yawning and some diarrhea too   - will give lorazepam 2mg  IV and reassess. Might need some opioid  Addendum 1:39 AM 12/01/2011  - Some improvement only with lorazepam. Patient now sitting naked in chair on side of bed  - will give71mcg IV fentanyl to counteract opioid withdrawal  - will try precdex gtt with fent prn and ativan prn  Addendum 3:52 AM   - Improved with precedex but agitated again  - will repeat haldol 2nd time x 5mg   - restraints       Intervention Category Major Interventions: Delirium, psychosis, severe agitation - evaluation and management  Kentravious Lipford 12/01/2011, 12:41 AM

## 2011-12-01 NOTE — Significant Event (Signed)
Pt very agitated/combative, and trying to pull out all lines tubes.  This occurred at about 2 pm today.  Given 100 mcg fentanyl, 5 mg versed, and then started diprivan gtt.  Coralyn Helling, MD 12/01/2011, 5:07 PM Pager:  [phone]

## 2011-12-02 ENCOUNTER — Inpatient Hospital Stay (HOSPITAL_COMMUNITY): Payer: Medicare Other

## 2011-12-02 LAB — BLOOD GAS, ARTERIAL
Acid-Base Excess: 5.3 mmol/L — ABNORMAL HIGH (ref 0.0–2.0)
Drawn by: 232811
FIO2: 0.3 %
MECHVT: 500 mL
O2 Saturation: 91 %
PEEP: 5 cmH2O
Patient temperature: 98.6
RATE: 18 resp/min

## 2011-12-02 LAB — CARDIAC PANEL(CRET KIN+CKTOT+MB+TROPI)
CK, MB: 16.7 ng/mL (ref 0.3–4.0)
Relative Index: 1.6 (ref 0.0–2.5)
Total CK: 1033 U/L — ABNORMAL HIGH (ref 7–232)
Troponin I: <0.3 ng/mL (ref <0.30)

## 2011-12-02 LAB — BASIC METABOLIC PANEL
BUN: 12 mg/dL (ref 6–23)
CO2: 27 mEq/L (ref 19–32)
Calcium: 8.8 mg/dL (ref 8.4–10.5)
Chloride: 103 mEq/L (ref 96–112)
Creatinine, Ser: 0.88 mg/dL (ref 0.50–1.35)
Glucose, Bld: 131 mg/dL — ABNORMAL HIGH (ref 70–99)

## 2011-12-02 LAB — GLUCOSE, CAPILLARY
Glucose-Capillary: 116 mg/dL — ABNORMAL HIGH (ref 70–99)
Glucose-Capillary: 146 mg/dL — ABNORMAL HIGH (ref 70–99)
Glucose-Capillary: 92 mg/dL (ref 70–99)

## 2011-12-02 LAB — CBC
HCT: 35.8 % — ABNORMAL LOW (ref 39.0–52.0)
Hemoglobin: 11.9 g/dL — ABNORMAL LOW (ref 13.0–17.0)
MCHC: 33.2 g/dL (ref 30.0–36.0)
RBC: 3.83 MIL/uL — ABNORMAL LOW (ref 4.22–5.81)

## 2011-12-02 LAB — MAGNESIUM: Magnesium: 1.2 mg/dL — ABNORMAL LOW (ref 1.5–2.5)

## 2011-12-02 LAB — CULTURE, RESPIRATORY W GRAM STAIN: Gram Stain: NONE SEEN

## 2011-12-02 MED ORDER — HALOPERIDOL LACTATE 5 MG/ML IJ SOLN
INTRAMUSCULAR | Status: AC
  Filled 2011-12-02: qty 2

## 2011-12-02 MED ORDER — POTASSIUM CHLORIDE 20 MEQ/15ML (10%) PO LIQD
40.0000 meq | Freq: Once | ORAL | Status: AC
  Administered 2011-12-02: 40 meq
  Filled 2011-12-02: qty 30

## 2011-12-02 MED ORDER — HALOPERIDOL LACTATE 5 MG/ML IJ SOLN
10.0000 mg | INTRAMUSCULAR | Status: AC
  Administered 2011-12-02: 10 mg via INTRAMUSCULAR
  Filled 2011-12-02: qty 2

## 2011-12-02 MED ORDER — FUROSEMIDE 10 MG/ML IJ SOLN
20.0000 mg | Freq: Four times a day (QID) | INTRAMUSCULAR | Status: AC
  Administered 2011-12-02 (×3): 20 mg via INTRAVENOUS
  Filled 2011-12-02 (×3): qty 2

## 2011-12-02 MED ORDER — POTASSIUM CHLORIDE 20 MEQ/15ML (10%) PO LIQD
40.0000 meq | Freq: Three times a day (TID) | ORAL | Status: AC
  Administered 2011-12-02: 40 meq
  Filled 2011-12-02: qty 30

## 2011-12-02 NOTE — Clinical Social Work Note (Signed)
CSW reviewed chart and spoke with RN. No family present currently. CSW will attempt to reach out to wife and follow.  Vennie Homans, [place] 12/02/2011 11:56 AM [phone]

## 2011-12-02 NOTE — Progress Notes (Signed)
Name: ENGELBERT SEVIN MRN: [id number] DOB: [birth year]    LOS: 3 Requesting [id number] Regarding: Shock/hypoxia  PCCM NOTE  History of Present Illness: 66 yo wm with chronic back pain and treated at local pain clinic. Family reports 24 hours of somnolence, confusion and low grade fever. Presented to [facility] ED 5/13 with 85/65 bp. Tx with narcan with abrupt arousal and complaints of pain. Creatine >6(on ace-i) Urine appeared dark and PCCM asked to admit.  Lines / Drains: ETT 5/15>>> L IJ TLC 5/15>>>5/16 L IJ TLC 5/16>>>  Cultures: 5/13 bc x 2>>NTD 5/13 uc>>NTD  Antibiotics: 5/13 vanc>>5/14 5/13 zoysn>>5/14 5/14 Cipro>>>  Tests / Events: 5/13 somulent and hypotensive 5/14 wide awake 5/15 severe withdrawal and agitation then developed respiratory failure.  Subjective: Vital Signs: Temp:  [97.6 F (36.4 C)-99.5 F (37.5 C)] 98.5 F (36.9 C) (05/16 0800) Pulse Rate:  [44-75] 66  (05/16 1000) Resp:  [17-23] 20  (05/16 1000) BP: (81-142)/(48-96) 105/62 mmHg (05/16 1000) SpO2:  [93 %-100 %] 94 % (05/16 1000) FiO2 (%):  [30 %-100 %] 40.4 % (05/16 1000) Weight:  [106 kg (233 lb 11 oz)-110.9 kg (244 lb 7.8 oz)] 106 kg (233 lb 11 oz) (05/16 0500) I/O last 3 completed shifts: In: 4503.7 [I.V.:3829; NG/GT:60; IV Piggyback:614.7] Out: 8851 [Urine:8850; Stool:1]   Intake/Output Summary (Last 24 hours) at 12/02/11 1031 Last data filed at 12/02/11 0800  Gross per 24 hour  Intake 2228.74 ml  Output   9310 ml  Net -7081.26 ml   Physical Examination: General:  WNWDWM more awake Neuro:  Awake and follows commands.  HEENT:  Short neck Cardiovascular:  hsr rrr Lungs:  Decreased bs bases Abdomen:  +bs Musculoskeletal:  intact Skin:  intact  Ventilator settings: Vent Mode:  [-] PRVC FiO2 (%):  [30 %-100 %] 40.4 % Set Rate:   [18 bmp] 18 bmp Vt Set:  [500 mL] 500 mL PEEP:  [5 cmH20] 5 cmH20 Pressure Support:  [5 cmH20] 5 cmH20 Plateau Pressure:  [16 cmH20-22 cmH20] 22 cmH20  Labs and Imaging:  Dg Chest Port 1 View  12/02/2011  *RADIOLOGY REPORT*  Clinical Data: Endotracheal tube position.  Respiratory difficulty.  PORTABLE CHEST - 1 VIEW  Comparison: Yesterday  Findings: Endotracheal tube tip remains 3.0 cm from the carina. Stable NG tube beyond the gastroesophageal junction.  Spinal cord stimulator projects over the lower thoracic spine.  Low volumes. Bibasilar atelectasis increased left greater than right.  Vascular congestion.  No sign of interstitial edema.  IMPRESSION: Stable endotracheal tube.  Bibasilar atelectasis left greater than right has worsened.  Original Report Authenticated By: Donavan Burnet, M.D.   Dg Chest Port 1 View  12/01/2011  *RADIOLOGY REPORT*  Clinical Data: Check tube placement.  PORTABLE CHEST - 1 VIEW  Comparison: 12/01/2011.  Findings: Endotracheal tube terminates approximately 2.7 cm above carina.  Left IJ  central line tip projects over the SVC.  Heart size normal.  Lungs are somewhat low in volume with mild interstitial prominence.  No definite pleural fluid.  IMPRESSION: Low lung volumes with probable mild vascular crowding.  Original Report Authenticated By: Reyes Ivan, M.D.   Dg Chest Port 1 View  12/01/2011  *RADIOLOGY REPORT*  Clinical Data: Status post intubation and central line placement.  PORTABLE CHEST - 1 VIEW  Comparison: Chest x-ray 12/01/2011.  Findings: An endotracheal tube is in place with tip 5.0 cm above the carina. There is a left-sided internal jugular central venous catheter with tip terminating in the proximal superior vena cava. Lung volumes are low.  No definite consolidative airspace disease. Linear opacity in the right lower lobe likely reflects subsegmental atelectasis.  No definite pleural effusions.  Pulmonary venous congestion (likely accentuated by the  low lung volumes) without frank pulmonary edema.  Heart size is normal. The patient is rotated to the right on today's exam, resulting in distortion of the mediastinal contours and reduced diagnostic sensitivity and specificity for mediastinal pathology.  Atherosclerotic calcifications within the arch of the aorta.  IMPRESSION: 1.  Support apparatus, as above. 2.  Low lung volumes with probable right lower lobe subsegmental atelectasis. 3.  Atherosclerosis.  Original Report Authenticated By: Florencia Reasons, M.D.   Dg Chest Port 1 View  12/01/2011  *RADIOLOGY REPORT*  Clinical Data: Sepsis  PORTABLE CHEST - 1 VIEW  Comparison: 11/30/2011; 11/29/2011; 08/21/2003  Findings:  Grossly unchanged cardiac silhouette and mediastinal contours given persistently reduced lung volumes.  Examination is degraded secondary exclusion of the right lung apex.  There is mild pulmonary venous congestion without frank evidence of pulmonary edema.  Perihilar heterogeneous opacities, right greater than left. Persistent mild elevation of the right hemidiaphragm.  No definite pneumothorax.  Grossly unchanged bones.  Spinal stimulator overlies the lower thoracic spine.  IMPRESSION: 1.  Mild pulmonary venous congestion without frank evidence of pulmonary edema. 2.  Persistently reduced lung volumes with perihilar opacities, right greater than left, possibly atelectasis.  Original Report Authenticated By: Waynard Reeds, M.D.    Lab 12/02/11 0325 12/01/11 0520 11/30/11 0545  NA 140 137 137  K 3.4* 4.6 5.3*  CL 103 108 109  CO2 27 22 20   BUN 12 14 33*  CREATININE 0.88 0.85 2.07*  GLUCOSE 131* 149* 113*    Lab 12/02/11 0325 12/01/11 0520 11/29/11 0910  HGB 11.9* 10.8* 13.1  HCT 35.8* 33.8* 40.8  WBC 6.7 5.0 10.9*  PLT 109* 99* 170   ABG    Component Value Date/Time   PHART 7.466* 12/02/2011 0438    Assessment and Plan: Shock from presumed sepsis ? Urinary source complicated by chronic narcotics. 5/14 improved.  5/15 resolved.  Now agitated and hypertensive. - Sepsis protocol complete, D/C IVF. - Abx changed to cipro alone for urinary source, CXR clear. - Cortisol level 5/13, 22, no need for replacement.  VDRF secondary to agitation/ams/opoid withdrawal A. ETT placed 5/15 for airway control P.  - Full vent support. - Sputum cul NTD. - Sedation protocol via fentanyl and propofol.  Renal failure in setting of ACE-I, presumed sepsis. Resolved 5/15 Lab Results  Component Value Date   CREATININE 0.88 12/02/2011   CREATININE 0.85 12/01/2011   CREATININE 2.07* 11/30/2011  - D/C ace-i. - KVO IVF. - Lasix as ordered. - Will replace TLC. - Renal US neg.  Chronic Pain - Fentanyl patch and fentanyl drip as ordered.  Recent fall  from ladder with injury to back - Lumbar films(neg)  Best practices / Disposition: -->ICU status under PCCM 5/14  -->full code -->Heparin for DVT Px -->Protonix for GI Px -->diet advance 5/14, npo 5/15. Place OGT 5/15 -->family updated at bedside  Will keep sedated and intubated, aggressively diurese today and pending mental status will consider weaning in AM, no PS trials today.  Replace K.  CC time 45 min.  Koren Bound, M.D. [phone]

## 2011-12-02 NOTE — Progress Notes (Signed)
Remains mechanically ventilated and on a propofol drip at 35 mcg, fentanyl drip at 75 mcg/hr. On continuous tubefeedings at 45 ml/hr (goal), residuals 20 ml. Oral care q2-4 hours per VAP protocol, hourly rounding, circulation and safety checks per restraint protocol. Bilateral wrist restraints to protect pt from extubation, severe agitation threatening safety, monitor, and medical treatment. Updates to family by phone.

## 2011-12-02 NOTE — Progress Notes (Deleted)
Name: Xavier White MRN: [id number] DOB: January 28, 1946    LOS: 3 Requesting [id number] Regarding: Shock/hypoxia  PCCM NOTE  History of Present Illness: 66 yo wm with chronic back pain and treated at local pain clinic. Family reports 24 hours of somnolence, confusion and low grade fever. Presented to [facility] ED 5/13 with 85/65 bp. Tx with narcan with abrupt arousal and complaints of pain. Creatine >6(on ace-i) Urine appeared dark and PCCM asked to admit.  Lines / Drains: ETT 5/15>>> L IJ TLC 5/15>>>5/15  Cultures: 5/13 bc x 2>>NTD 5/13 uc>>NTD 5/14 sputum>>few strep  Antibiotics: 5/13 vanc>>5/14 5/13 zoysn>>5/14 5/14 Cipro>>>  Tests / Events: 5/13 somulent and hypotensive 5/14 wide awake 5/15 severe withdrawal and agitation then developed respiratory failure.  Subjective: Vital Signs: Temp:  [97.6 F (36.4 C)-99.5 F (37.5 C)] 98.5 F (36.9 C) (05/16 0800) Pulse Rate:  [44-75] 66  (05/16 1000) Resp:  [17-23] 20  (05/16 1000) BP: (81-142)/(48-96) 105/62 mmHg (05/16 1000) SpO2:  [93 %-100 %] 94 % (05/16 1000) FiO2 (%):  [30 %-100 %] 40.4 % (05/16 1000) Weight:  [233 lb 11 oz (106 kg)-244 lb 7.8 oz (110.9 kg)] 233 lb 11 oz (106 kg) (05/16 0500) I/O last 3 completed shifts: In: 4503.7 [I.V.:3829; NG/GT:60; IV Piggyback:614.7] Out: 8851 [Urine:8850; Stool:1]   Intake/Output Summary (Last 24 hours) at 12/02/11 1042 Last data filed at 12/02/11 0800  Gross per 24 hour  Intake 2228.74 ml  Output   9310 ml  Net -7081.26 ml   Physical Examination: General:  Sedated on diprivan Neuro:  sedated HEENT:  Short neck Cardiovascular:  hsr rrr Lungs:  Decreased bs bases Abdomen:  +bs Musculoskeletal:  intact Skin:  intact  Ventilator settings: Vent Mode:  [-] PRVC FiO2 (%):  [30 %-100 %] 40.4 % Set Rate:  [18 bmp] 18  bmp Vt Set:  [500 mL] 500 mL PEEP:  [5 cmH20] 5 cmH20 Pressure Support:  [5 cmH20] 5 cmH20 Plateau Pressure:  [16 cmH20-22 cmH20] 22 cmH20  Labs and Imaging:  Dg Chest Port 1 View  12/02/2011  *RADIOLOGY REPORT*  Clinical Data: Endotracheal tube position.  Respiratory difficulty.  PORTABLE CHEST - 1 VIEW  Comparison: Yesterday  Findings: Endotracheal tube tip remains 3.0 cm from the carina. Stable NG tube beyond the gastroesophageal junction.  Spinal cord stimulator projects over the lower thoracic spine.  Low volumes. Bibasilar atelectasis increased left greater than right.  Vascular congestion.  No sign of interstitial edema.  IMPRESSION: Stable endotracheal tube.  Bibasilar atelectasis left greater than right has worsened.  Original Report Authenticated By: Donavan Burnet, M.D.   Dg Chest Port 1 View  12/01/2011  *RADIOLOGY REPORT*  Clinical Data: Check tube placement.  PORTABLE CHEST - 1 VIEW  Comparison: 12/01/2011.  Findings: Endotracheal tube terminates approximately 2.7 cm above carina.  Left IJ central line tip projects over  the SVC.  Heart size normal.  Lungs are somewhat low in volume with mild interstitial prominence.  No definite pleural fluid.  IMPRESSION: Low lung volumes with probable mild vascular crowding.  Original Report Authenticated By: Reyes Ivan, M.D.   Dg Chest Port 1 View  12/01/2011  *RADIOLOGY REPORT*  Clinical Data: Status post intubation and central line placement.  PORTABLE CHEST - 1 VIEW  Comparison: Chest x-ray 12/01/2011.  Findings: An endotracheal tube is in place with tip 5.0 cm above the carina. There is a left-sided internal jugular central venous catheter with tip terminating in the proximal superior vena cava. Lung volumes are low.  No definite consolidative airspace disease. Linear opacity in the right lower lobe likely reflects subsegmental atelectasis.  No definite pleural effusions.  Pulmonary venous congestion (likely accentuated by the low lung  volumes) without frank pulmonary edema.  Heart size is normal. The patient is rotated to the right on today's exam, resulting in distortion of the mediastinal contours and reduced diagnostic sensitivity and specificity for mediastinal pathology.  Atherosclerotic calcifications within the arch of the aorta.  IMPRESSION: 1.  Support apparatus, as above. 2.  Low lung volumes with probable right lower lobe subsegmental atelectasis. 3.  Atherosclerosis.  Original Report Authenticated By: Florencia Reasons, M.D.   Dg Chest Port 1 View  12/01/2011  *RADIOLOGY REPORT*  Clinical Data: Sepsis  PORTABLE CHEST - 1 VIEW  Comparison: 11/30/2011; 11/29/2011; 08/21/2003  Findings:  Grossly unchanged cardiac silhouette and mediastinal contours given persistently reduced lung volumes.  Examination is degraded secondary exclusion of the right lung apex.  There is mild pulmonary venous congestion without frank evidence of pulmonary edema.  Perihilar heterogeneous opacities, right greater than left. Persistent mild elevation of the right hemidiaphragm.  No definite pneumothorax.  Grossly unchanged bones.  Spinal stimulator overlies the lower thoracic spine.  IMPRESSION: 1.  Mild pulmonary venous congestion without frank evidence of pulmonary edema. 2.  Persistently reduced lung volumes with perihilar opacities, right greater than left, possibly atelectasis.  Original Report Authenticated By: Waynard Reeds, M.D.    Lab 12/02/11 0325 12/01/11 0520 11/30/11 0545  NA 140 137 137  K 3.4* 4.6 5.3*  CL 103 108 109  CO2 27 22 20   BUN 12 14 33*  CREATININE 0.88 0.85 2.07*  GLUCOSE 131* 149* 113*    Lab 12/02/11 0325 12/01/11 0520 11/29/11 0910  HGB 11.9* 10.8* 13.1  HCT 35.8* 33.8* 40.8  WBC 6.7 5.0 10.9*  PLT 109* 99* 170   ABG    Component Value Date/Time   PHART 7.466* 12/02/2011 0438    Assessment and Plan: Shock from presumed sepsis ? Urinary source complicated by chronic narcotics. 5/14 improved. 5/15  resolved.  Now agitated and hypertensive. - Sepsis protocol complete, D/C IVF. - Abx changed to cipro alone for urinary source, CXR clear. - Cortisol level 5/13, 22, no need for replacement.  VDRF secondary to agitation/ams/opoid withdrawal A. ETT placed 5/15 for airway control P.  - Vent till stable. - Check sputum cul. +strep - Sedation protocol.   Renal failure in setting of ACE-I, presumed sepsis. Resolved 5/15 Lab Results  Component Value Date   CREATININE 0.88 12/02/2011   CREATININE 0.85 12/01/2011   CREATININE 2.07* 11/30/2011  - D/C ace-i - KVO IVF. - Low dose lasix. - Place cvl to guide fluids if needed. CVL 5/15, replace 5/16 - Renal US neg  Chronic Pain - D/C narcotics till awake - PRN fentanyl. - Start  fentanyl patch 5/15 along with diprivan .  Recent fall from ladder with injury to back - Lumbar films(neg)  Best practices / Disposition: -->ICU status under PCCM 5/14  -->full code -->Heparin for DVT Px -->Protonix for GI Px -->diet advance 5/14, npo 5/15. Place OGT 5/15   [facility] Shahara Hartsfield ACNP Adolph Pollack PCCM Pager [phone] till 3 pm If no answer page [phone] 12/02/2011, 10:42 AM

## 2011-12-02 NOTE — Progress Notes (Signed)
eLink Physician-Brief Progress Note Patient Name: Xavier White DOB: [birth year] MRN: [id number]  Date of Service  12/02/2011   HPI/Events of Note  Patient agitated - lost central line.  No PIVs.  Currently camera evaluation shows several staff members at bedside attempting to restraint intubated patient.  HD stable with BP of 126/59 (77)   eICU Interventions  Plan: Haldol 10 mg IM now Obtain PIV access   Intervention Category Major Interventions: Delirium, psychosis, severe agitation - evaluation and management  Kingjames Coury 12/02/2011, 12:57 AM

## 2011-12-02 NOTE — Clinical Documentation Improvement (Signed)
CHANGE MENTAL STATUS DOCUMENTATION CLARIFICATION   THIS DOCUMENT IS NOT A PERMANENT PART OF THE MEDICAL RECORD  TO RESPOND TO THE THIS QUERY, FOLLOW THE INSTRUCTIONS BELOW:  1. If needed, update documentation for the patient's encounter via the notes activity.  2. Access this query again and click edit on the In [employer].  3. After updating, or not, click F2 to complete all highlighted (required) fields concerning your review. Select "additional documentation in the medical record" OR "no additional documentation provided".  4. Click Sign note button.  5. The deficiency will fall out of your In Basket *Please let us know if you are not able to complete this workflow by phone or e-mail (listed below).         12/02/11  Dear Dr. [facility]  In an effort to better capture your patient's severity of illness, reflect appropriate length of stay and utilization of resources, a review of the patient medical record has revealed the following indicators.    Based on your clinical judgment, please clarify and document in a progress note and/or discharge summary the clinical condition associated with the following supporting information:  In responding to this query please exercise your independent judgment.  The fact that a query is asked, does not imply that any particular answer is desired or expected.  5/16/Prog note..."5/13 somulent and hypotensive.Marland KitchenMarland Kitchen5/15 severe withdrawal and agitation then developed respiratory failure." For accurate Dx specificity & severity can change in "Mental Status" be further clarified/specified. Thank you   Possible Clinical Conditions?  Encephalopathy (describe type if known) - Anoxic - Septic - Alcoholic  - Hepatic - Hypertensive - Metabolic -Toxic  Drug induced confusion/delirium Acute confusion Acute delirium  Acute exacerbation of known dementia (indicate type) Coma Hyponatremia / Hypernatremia Poisoning / Overdose Hypoxemia /  Hypoxia  Other Condition (please specify)  Cannot Clinically Determine   Supporting Information: Risk Factors: 5/16 prog note.Marland KitchenMarland Kitchen"Family reports 24 hours of somnolence, confusion and low grade fever. Presented to [facility] ED 5/13 with 85/65 bp. Tx with narcan with abrupt arousal and complaints of pain."...  Signs & Symptoms: 5/16 prog note..."5/13 somulent and hypotensive.Marland KitchenMarland Kitchen5/14 wide awake.Marland KitchenMarland Kitchen5/15 severe withdrawal and agitation then developed respiratory failure.".Marland KitchenMarland Kitchen  Diagnostics: Lab: Radiology: [place]: EEG:  Treatment: 5/16 prog note..."Will keep sedated and intubated, aggressively diurese today and pending mental status will consider weaning in AM, no PS trials today. Replace K."...    Reviewed: additional documentation in the MEDICAL RECORD NUMBER5/19/13 per prog note>query closed.ORM  Thank You,  Toribio Harbour, RN, BSN, CCDS Certified Clinical Documentation Specialist Pager: [phone]  Health Information Management [place]

## 2011-12-02 NOTE — Procedures (Signed)
Central Venous Catheter Insertion Procedure Note Xavier White [id number] [birth year]  Procedure: Insertion of Central Venous Catheter Indications: Assessment of intravascular volume  Procedure Details Consent: Risks of procedure as well as the alternatives and risks of each were explained to the (patient/caregiver).  Consent for procedure obtained. Time Out: Verified patient identification, verified procedure, site/side was marked, verified correct patient position, special equipment/implants available, medications/allergies/relevent history reviewed, required imaging and test results available.  Performed  Maximum sterile technique was used including antiseptics, cap, gloves, gown, hand hygiene, mask and sheet. Skin prep: Chlorhexidine; local anesthetic administered A antimicrobial bonded/coated triple lumen catheter was placed in the right internal jugular vein using the Seldinger technique. Ultrasound guidance used.yes Catheter placed to 16 cm. Blood aspirated via all 3 ports and then flushed x 3. Line sutured x 2 and dressing applied.  Evaluation Blood flow good Complications: No apparent complications Patient did tolerate procedure well. Chest X-ray ordered to verify placement.  CXR: pending.  Brett Canales Minor ACNP Adolph Pollack PCCM Pager [phone] till 3 pm If no answer page [phone] 12/02/2011, 11:50 AM  U/S used in placement.  Patient seen and examined, agree with above note.  I dictated the care and orders written for this patient under my direction.  Koren Bound, M.D. [phone]

## 2011-12-02 NOTE — Progress Notes (Addendum)
0100 R arm PIV out. L IJ central line leaking. Severely agitated, MD called and haldol 10mg  IM given. IV team notified, PIV started in bilat UE with ultrasound guidance, propofol resumed at 50 mcg/kg/min, fentanyl at 75 mcg/hr. Incontinent of loose stool after bath and linen change, rebathed and bilateral wrist restraints reapplied to protect monitor, lines, and ventilator.

## 2011-12-03 ENCOUNTER — Inpatient Hospital Stay (HOSPITAL_COMMUNITY): Payer: Medicare Other

## 2011-12-03 DIAGNOSIS — M549 Dorsalgia, unspecified: Secondary | ICD-10-CM

## 2011-12-03 LAB — GLUCOSE, CAPILLARY
Glucose-Capillary: 130 mg/dL — ABNORMAL HIGH (ref 70–99)
Glucose-Capillary: 135 mg/dL — ABNORMAL HIGH (ref 70–99)
Glucose-Capillary: 144 mg/dL — ABNORMAL HIGH (ref 70–99)
Glucose-Capillary: 152 mg/dL — ABNORMAL HIGH (ref 70–99)

## 2011-12-03 LAB — BASIC METABOLIC PANEL
CO2: 35 mEq/L — ABNORMAL HIGH (ref 19–32)
Calcium: 8.6 mg/dL (ref 8.4–10.5)
Chloride: 99 mEq/L (ref 96–112)
Glucose, Bld: 143 mg/dL — ABNORMAL HIGH (ref 70–99)
Sodium: 142 mEq/L (ref 135–145)

## 2011-12-03 LAB — CBC
HCT: 39.8 % (ref 39.0–52.0)
Hemoglobin: 13.1 g/dL (ref 13.0–17.0)
RBC: 4.26 MIL/uL (ref 4.22–5.81)
WBC: 4.2 10*3/uL (ref 4.0–10.5)

## 2011-12-03 LAB — BLOOD GAS, ARTERIAL
Acid-Base Excess: 9.5 mmol/L — ABNORMAL HIGH (ref 0.0–2.0)
MECHVT: 500 mL
O2 Saturation: 89.6 %
Patient temperature: 98.6
TCO2: 30.2 mmol/L (ref 0–100)
pH, Arterial: 7.485 — ABNORMAL HIGH (ref 7.350–7.450)

## 2011-12-03 LAB — MAGNESIUM: Magnesium: 1.2 mg/dL — ABNORMAL LOW (ref 1.5–2.5)

## 2011-12-03 MED ORDER — POTASSIUM CHLORIDE 20 MEQ/15ML (10%) PO LIQD
40.0000 meq | Freq: Three times a day (TID) | ORAL | Status: AC
  Administered 2011-12-03 – 2011-12-04 (×3): 40 meq
  Filled 2011-12-03 (×4): qty 30

## 2011-12-03 MED ORDER — FUROSEMIDE 10 MG/ML IJ SOLN
20.0000 mg | Freq: Four times a day (QID) | INTRAMUSCULAR | Status: AC
Start: 2011-12-03 — End: 2011-12-04
  Administered 2011-12-03 – 2011-12-04 (×3): 20 mg via INTRAVENOUS
  Filled 2011-12-03 (×3): qty 2

## 2011-12-03 MED ORDER — POTASSIUM CHLORIDE 20 MEQ/15ML (10%) PO LIQD
40.0000 meq | Freq: Once | ORAL | Status: AC
  Administered 2011-12-03: 40 meq
  Filled 2011-12-03: qty 30

## 2011-12-03 MED ORDER — MAGNESIUM SULFATE 40 MG/ML IJ SOLN
2.0000 g | Freq: Once | INTRAMUSCULAR | Status: AC
  Administered 2011-12-03: 2 g via INTRAVENOUS
  Filled 2011-12-03: qty 50

## 2011-12-03 NOTE — Clinical Social Work Psychosocial (Signed)
Clinical Social Work Department BRIEF PSYCHOSOCIAL ASSESSMENT 12/03/2011  Patient:  Xavier White, Xavier White     Account Number:  [id number]     Admit date:  11/29/2011  Clinical Social Worker:  Jodelle Red  Date/Time:  12/03/2011 11:32 AM  Referred by:  CSW  Date Referred:  12/02/2011 Referred for  Other - See comment   Other Referral:   Interview type:  Family Other interview type:    PSYCHOSOCIAL DATA Living Status:  WIFE Admitted from facility:   Level of care:   Primary support name:  Xavier White Primary support relationship to patient:  SPOUSE Degree of support available:   GOOD FROM SPOUSE AND EXTENDED FAMILY    CURRENT CONCERNS Current Concerns  Adjustment to Illness   Other Concerns:    SOCIAL WORK ASSESSMENT / PLAN CSW spoke with wife, Xavier White at bedside. Pt has 11yo daughter and lives with her and his wife. Pt has grown children from a previous marriage per wife. Wife expressed great concern and anxiety for her husband and social work provided supportive listening. Pt had been doing well at home per wife. Wife stated she has had good support to assist her, but has struggled to cope.   Assessment/plan status:  Psychosocial Support/Ongoing Assessment of Needs Other assessment/ plan:   Resources as needed   Information/referral to community resources:    PATIENT'S/FAMILY'S RESPONSE TO PLAN OF CARE: Wife appreciative and agrees for CSW to follow. Wife plans to contact CSW as the need arises. CSW will follow closely.   Vennie Homans, [place] 12/03/2011 11:40 AM [phone]

## 2011-12-03 NOTE — Progress Notes (Signed)
CARE MANAGEMENT NOTE 12/03/2011  Patient:  Xavier White, Xavier White   Account Number:  [id number]  Date Initiated:  11/30/2011  Documentation initiated by:  Maleiya Pergola  Subjective/Objective Assessment:   pt with ams and hx of recent fall without knowledge of cht, confusion and agitation present.     Action/Plan:   lives at home with wife   Anticipated DC Date:  12/06/2011   Anticipated DC Plan:  HOME/SELF CARE  In-house referral  NA      DC Planning Services  NA      [facility] Choice  NA   Choice offered to / List presented to:  NA   DME arranged  NA      DME agency  NA     HH arranged  NA      HH agency  NA   Status of service:  In process, will continue to follow Medicare Important Message given?  YES (If response is "NO", the following Medicare IM given date fields will be blank) Date Medicare IM given:  11/29/2011 Date Additional Medicare IM given:    Discharge Disposition:    Per UR Regulation:  Reviewed for med. necessity/level of care/duration of stay  If discussed at Long Length of Stay Meetings, dates discussed:    Comments:  [id number]/Jafar Poffenberger Earlene Plater, RN, BSN, CCM No discharge needs present at time of this review at the sdu/icu level. Case Management [phone]    [id number] Marcelle Smiling, RN,BSN,CCM No discharge needs present at time of this review Case Management [phone]

## 2011-12-03 NOTE — Progress Notes (Signed)
eLink Physician-Brief Progress Note Patient Name: Xavier White DOB: 04-05-1946 MRN: [id number]  Date of Service  12/03/2011   HPI/Events of Note  hypomag and hypokalemia   eICU Interventions  Magnesium and potassium replaced   Intervention Category Intermediate Interventions: Electrolyte abnormality - evaluation and management  Rosalind Guido 12/03/2011, 5:00 AM

## 2011-12-03 NOTE — Progress Notes (Signed)
Name: Xavier White MRN: [id number] DOB: 1946-05-18    LOS: 4 Requesting WU:[id number] Regarding: Shock/hypoxia  PCCM NOTE  History of Present Illness: 66 yo wm with chronic back pain and treated at local pain clinic. Family reports 24 hours of somnolence, confusion and low grade fever. Presented to [facility] ED 5/13 with 85/65 bp. Tx with narcan with abrupt arousal and complaints of pain. Creatine >6(on ace-i) Urine appeared dark and PCCM asked to admit.  Lines / Drains: ETT 5/15>>> L IJ TLC 5/15>>>5/16 L IJ TLC 5/16>>>  Cultures: 5/13 bc x 2>>NTD 5/13 uc>>NTD  Antibiotics: 5/13 vanc>>5/14 5/13 zoysn>>5/14 5/14 Cipro>>>  Tests / Events: 5/13 somulent and hypotensive 5/14 wide awake 5/15 severe withdrawal and agitation then developed respiratory failure.  Subjective: Vital Signs: Temp:  [98.2 F (36.8 C)-100.2 F (37.9 C)] 98.2 F (36.8 C) (05/17 0800) Pulse Rate:  [53-76] 64  (05/17 0800) Resp:  [13-24] 17  (05/17 0800) BP: (92-132)/(54-73) 111/64 mmHg (05/17 0800) SpO2:  [91 %-100 %] 99 % (05/17 0800) FiO2 (%):  [39.7 %-50.1 %] 40 % (05/17 0800) Weight:  [103.8 kg (228 lb 13.4 oz)] 103.8 kg (228 lb 13.4 oz) (05/17 0500) I/O last 3 completed shifts: In: 3718.6 [I.V.:2339.6; Other:360; NG/GT:1015; IV Piggyback:4] Out: 47829 [Urine:10045]   Intake/Output Summary (Last 24 hours) at 12/03/11 0955 Last data filed at 12/03/11 0900  Gross per 24 hour  Intake 2288.19 ml  Output   6385 ml  Net -4096.81 ml   Physical Examination: General:  Sedated and intubated. Neuro:  Arousable but combative, moves all ext to command.  HEENT:  Short neck Cardiovascular:  hsr rrr Lungs:  Decreased bs bases Abdomen:  +bs Musculoskeletal:  intact Skin:  intact  Ventilator settings: Vent Mode:  [-] PRVC FiO2 (%):  [39.7 %-50.1 %]  40 % Set Rate:  [18 bmp] 18 bmp Vt Set:  [500 mL] 500 mL PEEP:  [5 cmH20] 5 cmH20 Plateau Pressure:  [14 cmH20-18 cmH20] 15 cmH20  Labs and Imaging:  Dg Chest Port 1 View  12/03/2011  *RADIOLOGY REPORT*  Clinical Data: 66 year old male with atelectasis.  Endotracheal tube placement.  PORTABLE CHEST - 1 VIEW  Comparison: 12/02/2011 and earlier.  Findings: Portable semi upright AP view 0435 hours.  Endotracheal tube remains in good position at the level of clavicles.  Enteric tube courses to the abdomen, tip not included.  Stable right IJ central line.  Thoracic spinal stimulator device re-identified. Slightly improved lung volumes and ventilation at the lung bases. No pneumothorax, pulmonary edema, definite effusion or confluent pulmonary opacity.  IMPRESSION: 1. Stable lines and tubes. 2.  Improved bibasilar ventilation, no acute cardiopulmonary abnormality.  Original Report Authenticated By: Harley Hallmark, M.D.   Dg Chest Port 1 View  12/02/2011  *RADIOLOGY REPORT*  Clinical Data: Central line placement.  PORTABLE CHEST - 1 VIEW  Comparison:  12/02/2011  Findings: Right central line is in place with the tip in the SVC. No pneumothorax.  Endotracheal tube and NG tube are unchanged. Heart is normal size.  Bibasilar atelectasis again noted, stable. Mild peribronchial thickening and interstitial prominence. Possible small effusions.  IMPRESSION: Right central line tip in the SVC.  No pneumothorax.  Stable bibasilar atelectasis and bronchitic changes.  Question small effusions.  Original Report Authenticated By: Cyndie Chime, M.D.   Dg Chest Port 1 View  12/02/2011  *RADIOLOGY REPORT*  Clinical Data: Endotracheal tube position.  Respiratory difficulty.  PORTABLE CHEST - 1 VIEW  Comparison: Yesterday  Findings: Endotracheal tube tip remains 3.0 cm from the carina. Stable NG tube beyond the gastroesophageal junction.  Spinal cord stimulator projects over the lower thoracic spine.  Low volumes. Bibasilar  atelectasis increased left greater than right.  Vascular congestion.  No sign of interstitial edema.  IMPRESSION: Stable endotracheal tube.  Bibasilar atelectasis left greater than right has worsened.  Original Report Authenticated By: Donavan Burnet, M.D.   Dg Chest Port 1 View  12/01/2011  *RADIOLOGY REPORT*  Clinical Data: Check tube placement.  PORTABLE CHEST - 1 VIEW  Comparison: 12/01/2011.  Findings: Endotracheal tube terminates approximately 2.7 cm above carina.  Left IJ central line tip projects over the SVC.  Heart size normal.  Lungs are somewhat low in volume with mild interstitial prominence.  No definite pleural fluid.  IMPRESSION: Low lung volumes with probable mild vascular crowding.  Original Report Authenticated By: Reyes Ivan, M.D.   Dg Chest Port 1 View  12/01/2011  *RADIOLOGY REPORT*  Clinical Data: Status post intubation and central line placement.  PORTABLE CHEST - 1 VIEW  Comparison: Chest x-ray 12/01/2011.  Findings: An endotracheal tube is in place with tip 5.0 cm above the carina. There is a left-sided internal jugular central venous catheter with tip terminating in the proximal superior vena cava. Lung volumes are low.  No definite consolidative airspace disease. Linear opacity in the right lower lobe likely reflects subsegmental atelectasis.  No definite pleural effusions.  Pulmonary venous congestion (likely accentuated by the low lung volumes) without frank pulmonary edema.  Heart size is normal. The patient is rotated to the right on today's exam, resulting in distortion of the mediastinal contours and reduced diagnostic sensitivity and specificity for mediastinal pathology.  Atherosclerotic calcifications within the arch of the aorta.  IMPRESSION: 1.  Support apparatus, as above. 2.  Low lung volumes with probable right lower lobe subsegmental atelectasis. 3.  Atherosclerosis.  Original Report Authenticated By: Florencia Reasons, M.D.    Lab 12/03/11 0411 12/02/11  0325 12/01/11 0520  NA 142 140 137  K 3.0* 3.4* 4.6  CL 99 103 108  CO2 35* 27 22  BUN 13 12 14   CREATININE 0.85 0.88 0.85  GLUCOSE 143* 131* 149*    Lab 12/03/11 0411 12/02/11 0325 12/01/11 0520  HGB 13.1 11.9* 10.8*  HCT 39.8 35.8* 33.8*  WBC 4.2 6.7 5.0  PLT 100* 109* 99*   ABG    Component Value Date/Time   PHART 7.485* 12/03/2011 0500    Assessment and Plan: Shock from presumed sepsis ? Urinary source complicated by chronic narcotics. 5/14 improved. 5/15 resolved.  Now agitated and hypertensive. - Sepsis protocol complete, D/C IVF. - Abx changed to cipro alone for urinary source, CXR clear. - Cortisol level 5/13, 22, no need for replacement as patient is hypertensive when agitated, hypotension is only with sedation.  VDRF secondary to  agitation/ams/opoid withdrawal A. ETT placed 5/15 for airway control while patient is withdrawing.  Was also fluid overloaded. P.  - Begin PS trials today. - Sputum cul NTD. - Sedation protocol via fentanyl and propofol. - Diureses again 5/17.  Renal failure in setting of ACE-I, presumed sepsis. Resolved 5/15 Lab Results  Component Value Date   CREATININE 0.85 12/03/2011   CREATININE 0.88 12/02/2011   CREATININE 0.85 12/01/2011  - D/C ace-i. - KVO IVF. - Lasix as ordered. - Replace K more aggressively. - Renal US neg.  Chronic Pain - Fentanyl patch and fentanyl drip as ordered.  Recent fall from ladder with injury to back - Lumbar films(neg)  CC time 45 min.  Koren Bound, M.D. [phone]

## 2011-12-04 ENCOUNTER — Inpatient Hospital Stay (HOSPITAL_COMMUNITY): Payer: Medicare Other

## 2011-12-04 DIAGNOSIS — F1110 Opioid abuse, uncomplicated: Secondary | ICD-10-CM

## 2011-12-04 DIAGNOSIS — G9340 Encephalopathy, unspecified: Secondary | ICD-10-CM

## 2011-12-04 LAB — BASIC METABOLIC PANEL
CO2: 34 mEq/L — ABNORMAL HIGH (ref 19–32)
GFR calc non Af Amer: >90 mL/min (ref >90)
Glucose, Bld: 182 mg/dL — ABNORMAL HIGH (ref 70–99)
Potassium: 3.7 mEq/L (ref 3.5–5.1)
Sodium: 140 mEq/L (ref 135–145)

## 2011-12-04 LAB — GLUCOSE, CAPILLARY
Glucose-Capillary: 128 mg/dL — ABNORMAL HIGH (ref 70–99)
Glucose-Capillary: 152 mg/dL — ABNORMAL HIGH (ref 70–99)
Glucose-Capillary: 138 mg/dL — ABNORMAL HIGH (ref 70–99)
Glucose-Capillary: 122 mg/dL — ABNORMAL HIGH (ref 70–99)

## 2011-12-04 LAB — MAGNESIUM: Magnesium: 1.7 mg/dL (ref 1.5–2.5)

## 2011-12-04 LAB — BLOOD GAS, ARTERIAL
Acid-Base Excess: 7.4 mmol/L — ABNORMAL HIGH (ref 0.0–2.0)
Bicarbonate: 32.8 mEq/L — ABNORMAL HIGH (ref 20.0–24.0)
FIO2: 1 %
O2 Saturation: 99.3 %
Patient temperature: 37
TCO2: 29.3 mmol/L (ref 0–100)
pO2, Arterial: 160 mmHg — ABNORMAL HIGH (ref 80.0–100.0)

## 2011-12-04 LAB — PHOSPHORUS: Phosphorus: 3.8 mg/dL (ref 2.3–4.6)

## 2011-12-04 LAB — CBC
Hemoglobin: 12.6 g/dL — ABNORMAL LOW (ref 13.0–17.0)
Platelets: 132 10*3/uL — ABNORMAL LOW (ref 150–400)
RBC: 4.15 MIL/uL — ABNORMAL LOW (ref 4.22–5.81)
WBC: 6.5 10*3/uL (ref 4.0–10.5)

## 2011-12-04 LAB — TRIGLYCERIDES: Triglycerides: 196 mg/dL — ABNORMAL HIGH (ref <150)

## 2011-12-04 MED ORDER — METHADONE HCL 5 MG PO TABS
10.0000 mg | ORAL_TABLET | Freq: Three times a day (TID) | ORAL | Status: DC
  Administered 2011-12-04: 10 mg via ORAL
  Filled 2011-12-04: qty 2

## 2011-12-04 MED ORDER — METHADONE HCL 5 MG PO TABS
5.0000 mg | ORAL_TABLET | Freq: Two times a day (BID) | ORAL | Status: DC
  Administered 2011-12-04: 5 mg via ORAL
  Filled 2011-12-04: qty 1

## 2011-12-04 MED ORDER — SODIUM CHLORIDE 0.9 % IV BOLUS (SEPSIS)
500.0000 mL | Freq: Once | INTRAVENOUS | Status: AC
  Administered 2011-12-04: 500 mL via INTRAVENOUS

## 2011-12-04 MED ORDER — METHADONE HCL 5 MG PO TABS: 10.0000 mg | ORAL_TABLET | Freq: Two times a day (BID) | ORAL | Status: DC

## 2011-12-04 NOTE — Progress Notes (Addendum)
Name: Xavier White MRN: [id number] DOB: [birth year]    LOS: 5 Requesting WU:[id number] Regarding: Shock/hypoxia  PCCM NOTE  History of Present Illness: 66 yo wm with chronic back pain and treated at local pain clinic. Family reports 24 hours of somnolence, confusion and low grade fever. Presented to [facility] ED 5/13 with 85/65 bp. Tx with narcan with abrupt arousal and complaints of pain. Creatine >6(on ace-i) Urine appeared dark and PCCM asked to admit.  Lines / Drains: ETT 5/15>>> L IJ TLC 5/15>>>5/16 L IJ TLC 5/16>>>  Cultures: 5/13 bc x 2>>NTD 5/13 uc>>NTD  Antibiotics: 5/13 vanc>>5/14 5/13 zoysn>>5/14 5/14 Cipro>>>  Tests / Events: 5/13 somulent and hypotensive 5/14 wide awake 5/15 severe withdrawal and agitation then developed respiratory failure.    SUBJECTIVE/OVERNIGHT/INTERVAL HX On fentanyl 100/h and diprivan 30 per hour and fent patch q72h and versed/ativan prn- > Gets very agitated easily even during change of diprivan. Curently RASS -3. Agitation and mental status only barrier to extubation. Diuresing well per RN. Fio2 40% on vent  Vital Signs: Temp:  [98.4 F (36.9 C)-99.7 F (37.6 C)] 99 F (37.2 C) (05/18 0800) Pulse Rate:  [55-71] 55  (05/18 0800) Resp:  [18-24] 18  (05/18 0800) BP: (83-141)/(53-86) 88/59 mmHg (05/18 0800) SpO2:  [91 %-100 %] 94 % (05/18 0800) FiO2 (%):  [39.9 %-40.3 %] 39.9 % (05/18 0800) Weight:  [100.6 kg (221 lb 12.5 oz)] 100.6 kg (221 lb 12.5 oz) (05/18 0500) I/O last 3 completed shifts: In: 3720.9 [I.V.:1425.9; Other:580; NG/GT:1715] Out: 6725 [Urine:6725]   Intake/Output Summary (Last 24 hours) at 12/04/11 0911 Last data filed at 12/04/11 0900  Gross per 24 hour  Intake 2530.97 ml  Output   4025 ml  Net -1494.03 ml   Physical Examination: General:  Sedated and  intubated. Neuro:  Arousable but combative, moves all ext to command.  HEENT:  Short neck Cardiovascular:  hsr rrr Lungs:  Decreased bs bases Abdomen:  +bs Musculoskeletal:  intact Skin:  intact  Ventilator settings: Vent Mode:  [-] PRVC FiO2 (%):  [39.9 %-40.3 %] 39.9 % Set Rate:  [18 bmp] 18 bmp Vt Set:  [500 mL] 500 mL PEEP:  [4.4 cmH20-5 cmH20] 5 cmH20 Plateau Pressure:  [9 cmH20-17 cmH20] 9 cmH20  Labs and Imaging:  Dg Chest Port 1 View  12/04/2011  *RADIOLOGY REPORT*  Clinical Data: Evaluate endotracheal tube positioning  PORTABLE CHEST - 1 VIEW  Comparison: 12/03/2011; 12/02/2011; 12/01/2011  Findings:  Grossly unchanged cardiac silhouette and mediastinal contours. Stable positioning of support apparatus.  Lung volumes remain persistently reduced.  There is mild pulmonary venous congestion without frank evidence of pulmonary edema.  Persistent mild elevation of the right hemidiaphragm.  No focal airspace opacities. No definite pleural effusions.  Grossly unchanged bones. Spinal stimulator overlies the lower thoracic spine.  IMPRESSION: 1.  Stable positioning of support  apparatus.  No pneumothorax. 2.  Mild pulmonary venous congestion without frank evidence of pulmonary edema.  Original Report Authenticated By: Waynard Reeds, M.D.   Dg Chest Port 1 View  12/03/2011  *RADIOLOGY REPORT*  Clinical Data: 66 year old male with atelectasis.  Endotracheal tube placement.  PORTABLE CHEST - 1 VIEW  Comparison: 12/02/2011 and earlier.  Findings: Portable semi upright AP view 0435 hours.  Endotracheal tube remains in good position at the level of clavicles.  Enteric tube courses to the abdomen, tip not included.  Stable right IJ central line.  Thoracic spinal stimulator device re-identified. Slightly improved lung volumes and ventilation at the lung bases. No pneumothorax, pulmonary edema, definite effusion or confluent pulmonary opacity.  IMPRESSION: 1. Stable lines and tubes. 2.  Improved  bibasilar ventilation, no acute cardiopulmonary abnormality.  Original Report Authenticated By: Harley Hallmark, M.D.   Dg Chest Port 1 View  12/02/2011  *RADIOLOGY REPORT*  Clinical Data: Central line placement.  PORTABLE CHEST - 1 VIEW  Comparison: 12/02/2011  Findings: Right central line is in place with the tip in the SVC. No pneumothorax.  Endotracheal tube and NG tube are unchanged. Heart is normal size.  Bibasilar atelectasis again noted, stable. Mild peribronchial thickening and interstitial prominence. Possible small effusions.  IMPRESSION: Right central line tip in the SVC.  No pneumothorax.  Stable bibasilar atelectasis and bronchitic changes.  Question small effusions.  Original Report Authenticated By: Cyndie Chime, M.D.    Lab 12/04/11 0350 12/03/11 0411 12/02/11 0325  NA 140 142 140  K 3.7 3.0* 3.4*  CL 98 99 103  CO2 34* 35* 27  BUN 17 13 12   CREATININE 0.83 0.85 0.88  GLUCOSE 182* 143* 131*    Lab 12/04/11 0350 12/03/11 0411 12/02/11 0325  HGB 12.6* 13.1 11.9*  HCT 38.8* 39.8 35.8*  WBC 6.5 4.2 6.7  PLT 132* 100* 109*   ABG    Component Value Date/Time   PHART 7.457* 12/04/2011 0329    Assessment and Plan: Shock from presumed sepsis ? Urinary source complicated by chronic narcotics. 5/14 improved. 5/15 resolved.  5/18 - no shock.  Now agitated and hypertensive. - Abx changed to cipro alone for urinary source, CXR clear.   VDRF secondary to agitation/ams/opoid withdrawal A. ETT placed 5/15 for airway control while patient is withdrawing.  Was also fluid overloaded. No results found for this basename: PROBNP:5 in the last 168 hours  - on 5/18- does not meet sbt criteria due to agitation and deliruim and high narc neds  P.  -Full vent support. - Sputum cul NTD. - Sedation protocol via fentanyl and propofol. - Diureses again 5/17 and 5/18  Renal failure in setting of ACE-I, presumed sepsis. Resolved 5/15 Lab Results  Component Value Date   CREATININE 0.83  12/04/2011   CREATININE 0.85 12/03/2011   CREATININE 0.88 12/02/2011  - D/C ace-i. - KVO IVF. - Lasix as ordered. - Replace K more aggressively. - Renal US neg.  NEUROLOLGY Chronic Back Pain - NArc dependence with hx of high dose opioid intake. Demonstrated classic opioid withdrawal prior to intubation.  Agitation and Delirium and encephalopathy on vent 12/04/11  PLAN  - DC fentanyl patch  - Continue fentanyl gtt at per hour (720mg  of MEDD po) with propofol gtt and benzo prn but aim to decrease fent by 1/3rd by 5/19  - STart methadone 5mg  bid (dose calculated based on 50% cross tolerance, QTc 0.43 currently); might be the best oral agent  for him due to cost and dependence issues - Will monitor QTc daily - Will increase methadone by 1/3 again on 5/19/ and reduce fent gtt by another 1/3 5/19 - Slowly reduce fentanyl gtt dosing as above     Recent fall from ladder with injury to back - Lumbar films(neg)   The patient is critically ill with multiple organ systems failure and requires high complexity decision making for assessment and support, frequent evaluation and titration of therapies, application of advanced monitoring technologies and extensive interpretation of multiple databases.   Critical Care Time devoted to patient care services described in this note is  45  Minutes.  Dr. Kalman Shan, M.D., [facility].C.P Pulmonary and Critical Care Medicine Staff Physician [facility] Pager: [phone], If no answer or between  15:00h - 7:00h: call [phone]  12/04/2011 9:30 AM

## 2011-12-04 NOTE — Progress Notes (Signed)
Remains sedated on propofol at 30 mcg, fentanyl at 100 mcg/hr, on ventilator FIO2 40%, updates to family at bedside. Tubefeedings at goal, oral care per VAP protocol Q2-4 hours.

## 2011-12-05 LAB — BASIC METABOLIC PANEL
CO2: 31 mEq/L (ref 19–32)
Chloride: 102 mEq/L (ref 96–112)
Sodium: 140 mEq/L (ref 135–145)

## 2011-12-05 LAB — GLUCOSE, CAPILLARY
Glucose-Capillary: 138 mg/dL — ABNORMAL HIGH (ref 70–99)
Glucose-Capillary: 157 mg/dL — ABNORMAL HIGH (ref 70–99)
Glucose-Capillary: 135 mg/dL — ABNORMAL HIGH (ref 70–99)

## 2011-12-05 LAB — MAGNESIUM: Magnesium: 1.8 mg/dL (ref 1.5–2.5)

## 2011-12-05 LAB — CULTURE, BLOOD (ROUTINE X 2)
Culture  Setup Time: 201305131430
Culture  Setup Time: 201305131430

## 2011-12-05 LAB — PHOSPHORUS: Phosphorus: 2.9 mg/dL (ref 2.3–4.6)

## 2011-12-05 MED ORDER — METHADONE HCL 5 MG PO TABS
7.5000 mg | ORAL_TABLET | Freq: Three times a day (TID) | ORAL | Status: DC
  Administered 2011-12-05 – 2011-12-06 (×3): 7.5 mg via ORAL
  Filled 2011-12-05 (×3): qty 2

## 2011-12-05 MED ORDER — DEXTROSE 5 % IV SOLN
1.0000 g | INTRAVENOUS | Status: DC
  Administered 2011-12-05: 1 g via INTRAVENOUS
  Filled 2011-12-05 (×2): qty 10

## 2011-12-05 NOTE — Progress Notes (Addendum)
Name: Xavier White MRN: [id number] DOB: October 09, 1945   Date of admit 11/29/2011   LOS: 6 Requesting WU:[id number] Regarding: Shock/hypoxia  PCCM NOTE  History of Present Illness: 66 yo wm with chronic back pain and treated at local pain clinic (on minimum 360mg   Of morphine equivalent daily dose) but also on ace inhibitor. Family reports 24 hours of somnolence, confusion and low grade fever. Presented to [facility] ED 5/13 with 85/65 bp. Tx with narcan with abrupt arousal and complaints of pain. Creatine >6(on ace-i) Urine appeared dark and PCCM asked to admit.  Lines / Drains: ETT 5/15>>> L IJ TLC 5/15>>>5/16 L IJ TLC 5/16>>>  Cultures: 5/13 bc x 2>>NTD 5/13 uc>>NTD    Lab 11/29/11 1427  PROCALCITON 0.19      Antibiotics: 5/13 vanc>>5/14 5/13 zoysn>>5/14 5/14 Cipro>>>5/20  Tests / Events: 5/13 somulent and hypotensive. Renal failure. Admitted 5/14 wide awake 5/15 severe withdrawal and agitation then developed respiratory failure. Intubated 5/18 - methadone slow escalation up started;   SUBJECTIVE/OVERNIGHT/INTERVAL HX Methadone started yesterday -> fentanyl gtt  need down to 42mcg/h but still on diprivan 30 and unable to titrate this down.  Gets very agitated easily even during change of diprivan. Curently RASS -3. Agitation and mental status only barrier to extubation. Diuresing well per RN. Fio2 40% on vent  Qtc 0.4 on monitor  Vital Signs: Temp:  [98.7 F (37.1 C)-99.2 F (37.3 C)] 98.7 F (37.1 C) (05/19 0400) Pulse Rate:  [55-73] 58  (05/19 0800) Resp:  [18-20] 18  (05/19 0800) BP: (82-119)/(49-69) 94/62 mmHg (05/19 0800) SpO2:  [88 %-100 %] 91 % (05/19 0800) FiO2 (%):  [39.7 %-100 %] 40.1 % (05/19 0900) Weight:  [101.5 kg (223 lb 12.3 oz)] 101.5 kg (223 lb 12.3 oz) (05/19 0400) I/O last 3 completed  shifts: In: 3616 [I.V.:1746; Other:150; NG/GT:1720] Out: 3770 [Urine:3770]   Intake/Output Summary (Last 24 hours) at 12/05/11 0938 Last data filed at 12/05/11 0900  Gross per 24 hour  Intake   2352 ml  Output   1820 ml  Net    532 ml   Physical Examination: General:  Sedated and intubated. Neuro:  Arousable but combative, moves all ext to command.  HEENT:  Short neck Cardiovascular:  hsr rrr Lungs:  Decreased bs bases Abdomen:  +bs Musculoskeletal:  intact Skin:  intact  Ventilator settings: Vent Mode:  [-] PRVC FiO2 (%):  [39.7 %-100 %] 40.1 % Set Rate:  [18 bmp] 18 bmp Vt Set:  [500 mL] 500 mL PEEP:  [4.8 cmH20-5.1 cmH20] 5.1 cmH20 Plateau Pressure:  [10 cmH20-19 cmH20] 19 cmH20  Labs and Imaging:    Lab 12/04/11 0350 12/03/11 0411 12/02/11 0325  NA 140 142 140  K 3.7 3.0* 3.4*  CL 98 99 103  CO2 34* 35* 27  BUN 17 13 12   CREATININE 0.83 0.85 0.88  GLUCOSE 182* 143* 131*    Lab 12/04/11 0350 12/03/11 0411 12/02/11 0325  HGB 12.6* 13.1 11.9*  HCT 38.8* 39.8 35.8*  WBC 6.5 4.2 6.7  PLT 132* 100* 109*   ABG    Component Value Date/Time   PHART 7.424 12/04/2011 1305      Principal Problem:  *Sepsis Active Problems:  Pain radiating to back  Hypotension  Renal failure  Respiratory failure  Acute respiratory failure  Pulmonary edema  Encephalopathy acute   ASSESSMENT AND PLAN  PULMONARY  Lab 12/04/11 1305 12/04/11 0329 12/03/11 0500 12/02/11 0438 12/01/11 1125  PHART 7.424 7.457* 7.485* 7.466* 7.307*  PCO2ART 51.0* 47.4* 45.6* 40.9 44.6  PO2ART 160.0* 66.2* 53.8* 54.5* 72.6*  HCO3 32.8* 33.0* 33.9* 29.0* 21.6  O2SAT 99.3 93.9 89.6 91.0 95.7   Ventilator Settings: Vent Mode:  [-] PRVC FiO2 (%):  [39.7 %-100 %] 40.1 % Set Rate:  [18 bmp] 18 bmp Vt Set:  [500 mL] 500 mL PEEP:  [4.8 cmH20-5.1 cmH20] 5.1 cmH20 Plateau Pressure:  [14 cmH20-19 cmH20] 19 cmH20 CXR:  Dg Chest Port 1 View  12/04/2011  *RADIOLOGY REPORT*  Clinical Data:  66 year old male with low grade fever and on ventilator.  PORTABLE CHEST - 1 VIEW  Comparison: 12/04/2011 and prior chest radiographs  Findings: An endotracheal tube is again identified with tip 5 cm above the carina, and NG tube entering the stomach with tip off the field of view, a right IJ central venous catheter with tip overlying the upper SVC, and thoracic spinal neurostimulator Mild peribronchial thickening is noted. There is no evidence of focal airspace disease, pulmonary edema, suspicious pulmonary nodule/mass, pleural effusion, or pneumothorax. No acute bony abnormalities are identified.  IMPRESSION:  Stable chest radiograph with support apparatus as described.  Original Report Authenticated By: Rosendo Gros, M.D.   Dg Chest Port 1 View  12/04/2011  *RADIOLOGY REPORT*  Clinical Data: Evaluate endotracheal tube positioning  PORTABLE CHEST - 1 VIEW  Comparison: 12/03/2011; 12/02/2011; 12/01/2011  Findings:  Grossly unchanged cardiac silhouette and mediastinal contours. Stable positioning of support apparatus.  Lung volumes remain persistently reduced.  There is mild pulmonary venous congestion without frank evidence of pulmonary edema.  Persistent mild elevation of the right hemidiaphragm.  No focal airspace opacities. No definite pleural effusions.  Grossly unchanged bones. Spinal stimulator overlies the lower thoracic spine.  IMPRESSION: 1.  Stable positioning of support apparatus.  No pneumothorax. 2.  Mild pulmonary venous congestion without frank evidence of pulmonary edema.  Original Report Authenticated By: Waynard Reeds, M.D.     A:  Acute Respiratory failure since 12/01/11, 2 days post admit due delirium due to opioid withdrawal.  - On 12/05/11 fails WUA due to agiation. Not a SBT candidate P:   Full vent support  CARDIOVASCULAR  Lab 12/02/11 0325 12/01/11 2010 12/01/11 1245 11/30/11 0545 11/29/11 2143 11/29/11 0910  TROPONINI <0.30 <0.30 <0.30 <0.30 <0.30 --  LATICACIDVEN -- -- --  -- -- 1.3  PROBNP -- -- -- -- -- --   ECG:  On 12/05/11 - sinus with QTc 0.4 Lines:  L IJ TLC 5/15>>>5/16, L IJ TLC 5/16>>  A: Nil acute. QTc normal 5/19 while on methadone since 5/18 P:  Monitor QTc while on methadone; ensure Qtc < 500 Check bnp  5/20 and decide on re-diuresis (was given lasxi 5/16 and 5/17)  RENAL  Lab 12/04/11 0350 12/03/11 0411 12/02/11 0325 12/01/11 0520 11/30/11 0545 11/29/11 1427  NA 140 142 140 137  137 --  K 3.7 3.0* -- -- -- --  CL 98 99 103 108 109 --  CO2 34* 35* 27 22 20  --  BUN 17 13 12 14  33* --  CREATININE 0.83 0.85 0.88 0.85 2.07* --  CALCIUM 9.1 8.6 8.8 8.2* 6.9* --  MG 1.7 1.2* 1.2* 1.6 -- 2.2  PHOS 3.8 3.8 2.0* 1.8* -- 5.6*   Intake/Output      05/18 0701 - 05/19 0700 05/19 0701 - 05/20 0700   I.V. (mL/kg) 1152 (11.3) 89 (0.9)   Other 30    NG/GT 1180 90   Total Intake(mL/kg) 2362 (23.3) 179 (1.8)   Urine (mL/kg/hr) 1520 (0.6) 300   Total Output 1520 300   Net +842 -121        Stool Occurrence      Foley:  ? date  A:  Renal failure at admission 5/13 (in setting of ACE inhibitor). Resolved by 5/15. Nil acute since 5/15 P:   Check lytes 12/05/11 Avoid ACE inhibitor  GASTROINTESTINAL  Lab 11/29/11 1427 11/29/11 0910  AST 121* 81*  ALT 39 37  ALKPHOS 95 112  BILITOT 0.5 0.6  PROT 6.9 7.9  ALBUMIN 2.8* 3.3*    A:  Nil acute. On tube feeds P:   Continue tube feeds  HEMATOLOGIC  Lab 12/04/11 0350 12/03/11 0411 12/02/11 0325 12/01/11 0520 11/29/11 1427 11/29/11 0910  HGB 12.6* 13.1 11.9* 10.8* -- 13.1  HCT 38.8* 39.8 35.8* 33.8* -- 40.8  PLT 132* 100* 109* 99* -- 170  INR -- -- -- -- 1.18 --  APTT -- -- -- -- 40* --   A:  Anemia of critical illness P:  - PRBC for hgb </= 6.9gm%    - exceptions are   -  if ACS susepcted/confirmed then transfuse for hgb </= 8.0gm%,  or    -  If septic shock first 24h and scvo2 < 70% then transfuse for hgb </= 9.0gm%   - active bleeding with hemodynamic instability, then transfuse  regardless of hemoglobin value   At at all times try to transfuse 1 unit prbc as possible with exception of active hemorrhage    INFECTIOUS  Lab 12/04/11 0350 12/03/11 0411 12/02/11 0325 12/01/11 0520 11/29/11 1427 11/29/11 0910  WBC 6.5 4.2 6.7 5.0 -- 10.9*  PROCALCITON -- -- -- -- 0.19 --   Cultures: Results for orders placed during the hospital encounter of 11/29/11  CULTURE, BLOOD (ROUTINE X 2)     Status: Normal   Collection Time   11/29/11  9:10 AM      Component Value Range Status Comment   Specimen Description BLOOD RIGHT ARM   Final    Special Requests BOTTLES DRAWN AEROBIC AND ANAEROBIC   Final    Culture  Setup Time [id number]   Final    Culture NO GROWTH 5 DAYS   Final    Report Status 12/05/2011 FINAL   Final   CULTURE, BLOOD (ROUTINE X 2)     Status: Normal   Collection Time   11/29/11  9:15 AM      Component Value Range Status Comment   Specimen Description BLOOD LEFT FOREARM   Final    Special Requests BOTTLES DRAWN AEROBIC AND ANAEROBIC   Final    Culture  Setup Time [id number]   Final    Culture NO GROWTH 5 DAYS   Final    Report Status 12/05/2011 FINAL   Final   URINE CULTURE  Status: Normal   Collection Time   11/29/11  9:40 AM      Component Value Range Status Comment   Specimen Description URINE, CATHETERIZED   Final    Special Requests NONE   Final    Culture  Setup Time [id number]   Final    Colony Count NO GROWTH   Final    Culture NO GROWTH   Final    Report Status 11/30/2011 FINAL   Final   MRSA PCR SCREENING     Status: Abnormal   Collection Time   11/29/11  5:56 PM      Component Value Range Status Comment   MRSA by PCR INVALID RESULTS, SPECIMEN SENT FOR CULTURE (*) NEGATIVE  Final   MRSA CULTURE     Status: Normal   Collection Time   11/29/11  5:56 PM      Component Value Range Status Comment   Specimen Description NOSE   Final    Special Requests NONE   Final    Culture     Final    Value: STAPHYLOCOCCUS AUREUS     Note:  NO MRSA ISOLATED   Report Status 12/01/2011 FINAL   Final   CULTURE, RESPIRATORY     Status: Normal   Collection Time   11/30/11  3:41 AM      Component Value Range Status Comment   Specimen Description TRACHEAL ASPIRATE   Final    Special Requests Normal   Final    Gram Stain     Final    Value: NO WBC SEEN     ABUNDANT SQUAMOUS EPITHELIAL CELLS PRESENT     RARE GRAM POSITIVE COCCI IN PAIRS     FEW GRAM POSITIVE RODS   Culture FEW STREPTOCOCCUS,BETA HEMOLYIC NOT GROUP A   Final    Report Status 12/02/2011 FINAL   Final     Antibiotics: 5/13 vanc>>5/14 5/13 zoysn>>5/14 5/14 Cipro>>>5/19 5/19 Ceftriaxone >>  A:  UTI v pulmonayr source suspected. On 12/05/11 review suggest pulmonary source. AFebrile since 12/02/11 P:   DC cipro (has delirium) Start cefitriaxone 5/19 and end 5/21 (check PCT 5/20)   ENDOCRINE  Lab 12/05/11 0824 12/05/11 0409 12/04/11 2338 12/04/11 1959 12/04/11 1557  GLUCAP 138* 150* 167* 139* 122*   A:  Nil acute   P:   Monitor ssi  NEUROLOGIC  A:  Significant home opioid intake for back pain ( 360 mg morphine equivalent daily dose). At presentation likely had opioid neuro toxicity due to renal failure. Then intubated 5/15 due to acute encephalopathy (related to opioid withdrawal, opioid neurotoXitiy AND infection likely)  - 5/18 - significant delirium while on diprivan 30, fent 100 and fent patch and benzo prn. Methadone started  - On 5/19 : improved fentanyl gtt need after starting methadone 5/18 but still very delirious on wua  P:   - Methadone will likely be best home po narc for him (cheap, less deliriogenic, ok in renal failure, and can tackle neuropathic component and less dependency potential) - Calculated methadone po dosing for 360mg  MEDD is 10mg  q8h (based on home needs) -increase methadone on 5/19 to 7.5mg  q8h and adjust fentanyl down by 1/3rd  - then depending on course between 5/20 and 5/22 increase methadone to 10mg  q8h and hold for 3-7 days  to assess full effect - check QTc daily  - Can consider haldol or precedex for delirium but QTc needs to be monitored   MUSCULSKELETAL  - RECENT FALL FROMLADDER TO BACK  -  LUMBAR FILM NEGATIVE  BEST PRACTICE / DISPOSITION - Level of Care:  ICU - Primary Service:  PCCM - Consultants:  None - Code Status:  Full - Diet:  Tube feeds - DVT Px:  heparin - GI Px:  protonix - Skin Integrity:  Intact per rn on 12/05/11 - Social / Family:  Wife comes after MD rounds per Virgina Jock, M.D. Pulmonary and Critical Care Medicine [facility] Pager: [phone]  12/05/2011, 10:05 AM     The patient is critically ill with multiple organ systems failure and requires high complexity decision making for assessment and support, frequent evaluation and titration of therapies, application of advanced monitoring technologies and extensive interpretation of multiple databases.   Critical Care Time devoted to patient care services described in this note is  45  Minutes.  Dr. Kalman Shan, M.D., [facility].C.P Pulmonary and Critical Care Medicine Staff Physician [facility] Pager: [phone], If no answer or between  15:00h - 7:00h: call [phone]  12/05/2011 9:38 AM

## 2011-12-06 ENCOUNTER — Inpatient Hospital Stay (HOSPITAL_COMMUNITY): Payer: Medicare Other

## 2011-12-06 DIAGNOSIS — G8929 Other chronic pain: Secondary | ICD-10-CM | POA: Diagnosis present

## 2011-12-06 DIAGNOSIS — F1120 Opioid dependence, uncomplicated: Secondary | ICD-10-CM | POA: Diagnosis present

## 2011-12-06 LAB — GLUCOSE, CAPILLARY
Glucose-Capillary: 116 mg/dL — ABNORMAL HIGH (ref 70–99)
Glucose-Capillary: 97 mg/dL (ref 70–99)

## 2011-12-06 LAB — BASIC METABOLIC PANEL
Calcium: 8.9 mg/dL (ref 8.4–10.5)
Creatinine, Ser: 0.73 mg/dL (ref 0.50–1.35)
GFR calc Af Amer: >90 mL/min (ref >90)

## 2011-12-06 LAB — MAGNESIUM: Magnesium: 1.8 mg/dL (ref 1.5–2.5)

## 2011-12-06 MED ORDER — HALOPERIDOL LACTATE 5 MG/ML IJ SOLN
INTRAMUSCULAR | Status: AC
  Administered 2011-12-06: 4 mg
  Filled 2011-12-06: qty 1

## 2011-12-06 MED ORDER — ENOXAPARIN SODIUM 40 MG/0.4ML ~~LOC~~ SOLN
40.0000 mg | SUBCUTANEOUS | Status: DC
  Administered 2011-12-06 – 2011-12-09 (×4): 40 mg via SUBCUTANEOUS
  Filled 2011-12-06 (×5): qty 0.4

## 2011-12-06 MED ORDER — FENTANYL CITRATE 0.05 MG/ML IJ SOLN: 25.0000 ug | INTRAMUSCULAR | Status: DC | PRN

## 2011-12-06 MED ORDER — HALOPERIDOL LACTATE 5 MG/ML IJ SOLN: 1.0000 mg | INTRAMUSCULAR | Status: DC | PRN

## 2011-12-06 MED ORDER — DIPHENHYDRAMINE HCL 50 MG/ML IJ SOLN
25.0000 mg | Freq: Once | INTRAMUSCULAR | Status: AC
  Administered 2011-12-06: 25 mg via INTRAVENOUS
  Filled 2011-12-06: qty 1

## 2011-12-06 NOTE — Progress Notes (Addendum)
Pt extubated 0955 to 6L [place]. Pt having loose stools every hour. Pt confused and agitated, pulling off leads and gown. Restrained with Posey belt. Order for PRN Haldol obtained and administered at 1550. Family at bedside 1730. CVL maintained due to poor venous access (2 nurses and IV team attempted PIV). New dressing placed on CVL and PCXR confirmed placement again.

## 2011-12-06 NOTE — Procedures (Signed)
Extubation Procedure Note  Patient Details:   Name: Xavier White DOB: [birth year] MRN: [id number]   Airway Documentation:  Airway 8 mm (Active)  Secured at (cm) 26 cm 12/06/2011  7:50 AM  Measured From Lips 12/06/2011  7:50 AM  Secured Location Left 12/06/2011  7:50 AM  Secured By [employer] 12/06/2011  7:50 AM  Tube Holder Repositioned Yes 12/06/2011  7:50 AM  Cuff Pressure (cm H2O) 26 cm H2O 12/06/2011  7:50 AM    Evaluation  O2 sats: stable throughout Complications: No apparent complications Patient did tolerate procedure well. Bilateral Breath Sounds: Diminished Suctioning: Airway Yes  Leonides Schanz 12/06/2011, 10:16 AM

## 2011-12-06 NOTE — Progress Notes (Signed)
CARE MANAGEMENT NOTE 12/06/2011  Patient:  Xavier White, Xavier White   Account Number:  [id number]  Date Initiated:  11/30/2011  Documentation initiated by:  Vedha Tercero  Subjective/Objective Assessment:   pt with ams and hx of recent fall without knowledge of cht, confusion and agitation present.     Action/Plan:   lives at home with wife   Anticipated DC Date:  12/09/2011   Anticipated DC Plan:  HOME/SELF CARE  In-house referral  NA      DC Planning Services  NA      [facility] Choice  NA   Choice offered to / List presented to:  NA   DME arranged  NA      DME agency  NA     HH arranged  NA      HH agency  NA   Status of service:  In process, will continue to follow Medicare Important Message given?  YES (If response is "NO", the following Medicare IM given date fields will be blank) Date Medicare IM given:  11/29/2011 Date Additional Medicare IM given:    Discharge Disposition:    Per UR Regulation:  Reviewed for med. necessity/level of care/duration of stay  If discussed at Long Length of Stay Meetings, dates discussed:    Comments:  [id number]/Whitlee Sluder Earlene Plater, RN, BSN, CCM patient on vent until am of [id number].  Extubated at 1000. Tolerate well.  patient remains very confused and combative, multiple loose stools. No discharge needs present at time of this review at the sdu/icu level. Case Management [phone]  [id number] Earlene Plater, RN, BSN, CCM No discharge needs present at time of this review at the sdu/icu level. Case Management [phone]    [id number] Marcelle Smiling, RN,BSN,CCM No discharge needs present at time of this review Case Management [phone]

## 2011-12-07 DIAGNOSIS — G8929 Other chronic pain: Secondary | ICD-10-CM

## 2011-12-07 DIAGNOSIS — F1120 Opioid dependence, uncomplicated: Secondary | ICD-10-CM

## 2011-12-07 LAB — GLUCOSE, CAPILLARY
Glucose-Capillary: 130 mg/dL — ABNORMAL HIGH (ref 70–99)
Glucose-Capillary: 140 mg/dL — ABNORMAL HIGH (ref 70–99)

## 2011-12-07 MED ORDER — OXYCODONE HCL 5 MG PO TABS
5.0000 mg | ORAL_TABLET | ORAL | Status: DC | PRN
  Administered 2011-12-07 – 2011-12-09 (×9): 5 mg via ORAL
  Filled 2011-12-07 (×9): qty 1

## 2011-12-07 NOTE — Progress Notes (Signed)
Name: Xavier White MRN: [id number] DOB: 01/30/1946   Date of admit 11/29/2011   LOS: 8 Requesting WU:[id number] Regarding: Shock/hypoxia  PCCM NOTE  History of Present Illness: 66 yo wm with chronic back pain and treated at local pain clinic (on minimum 360mg   Of morphine equivalent daily dose) but also on ace inhibitor. Family reports 24 hours of somnolence, confusion and low grade fever. Presented to [facility] ED 5/13 with 85/65 bp. Tx with narcan with abrupt arousal and complaints of pain. Creatine >6(on ace-i) Urine appeared dark and PCCM asked to admit.  Lines / Drains: ETT 5/15>>> 5/20 L IJ TLC 5/15>>>5/16 L IJ TLC 5/16>>>  Cultures: 5/13 bc x 2>>NTD 5/13 uc>>NTD    Lab 12/05/11 1100  PROCALCITON <0.10      Antibiotics: 5/13 vanc>>5/14 5/13 zoysn>>5/14 5/14 Cipro>>>5/20  Tests / Events: 5/13 somulent and hypotensive. Renal failure. Admitted 5/14 wide awake 5/15 severe withdrawal and agitation then developed respiratory failure. Intubated    SUBJECTIVE/OVERNIGHT/INTERVAL HX Has tolerated extubation well from respiratory point of view.  Agitated delirium yesterday - improved   Vital Signs: Temp:  [97.9 F (36.6 C)-99.2 F (37.3 C)] 99.2 F (37.3 C) (05/21 1200) Pulse Rate:  [54-69] 58  (05/21 1300) Resp:  [17-31] 31  (05/21 1300) BP: (108-136)/(49-78) 136/72 mmHg (05/21 1115) SpO2:  [92 %-95 %] 93 % (05/21 1300) I/O last 3 completed shifts: In: 1324.1 [I.V.:694.1; NG/GT:630] Out: 1980 [Urine:1980]   Intake/Output Summary (Last 24 hours) at 12/07/11 1420 Last data filed at 12/07/11 1200  Gross per 24 hour  Intake      0 ml  Output   1100 ml  Net  -1100 ml   Physical Examination: General:  RASS 0 to +1. + F/C. Poorly oriented. Neuro:  No focal deficits.  HEENT:  WNL Cardiovascular:  hsr  rrr Lungs: Clear anteriorly Abdomen:  Soft, NT, NABS Ext: no edema   Labs and Imaging:    Lab 12/06/11 0420 12/05/11 1100 12/04/11 0350  NA 140 140 140  K 3.7 3.9 3.7  CL 102 102 98  CO2 30 31 34*  BUN 19 19 17   CREATININE 0.73 0.77 0.83  GLUCOSE 153* 145* 182*    Lab 12/04/11 0350 12/03/11 0411 12/02/11 0325  HGB 12.6* 13.1 11.9*  HCT 38.8* 39.8 35.8*  WBC 6.5 4.2 6.7  PLT 132* 100* 109*   ABG    Component Value Date/Time   PHART 7.424 12/04/2011 1305      Principal Problem:  *Acute respiratory failure Active Problems:  Encephalopathy acute  Chronic back pain  Opioid dependence  Renal failure  Hypotension  Sepsis   ASSESSMENT AND PLAN  PULMONARY  CXR:  No new cxr   A:  Acute Respiratory failure since 12/01/11, 2 days post admit due delirium due to opioid withdrawal.  - Extubated 5/20 - tolerating well P:  Cont post extubation respiratory care  CARDIOVASCULAR Lines:  L IJ TLC 5/15>>>5/16, L IJ TLC 5/16>> 5/20    RENAL  Intake/Output      05/20 0701 - 05/21 0700 05/21 0701 - 05/22 0700   I.V. (mL/kg) 139.1 (1.4)    NG/GT 135    IV Piggyback     Total Intake(mL/kg) 274.1 (2.7)    Urine (mL/kg/hr) 1400 (0.6) 225 (0.3)   Total Output 1400 225   Net -1125.9 -225        Stool Occurrence 2 x 2 x    D/C Foley:    A:  Renal failure at admission 5/13 (in setting of ACE inhibitor). Resolved by 5/15. Nil acute since 5/15   GASTROINTESTINAL Begin PO diet     INFECTIOUS  Cultures:  All micro reviewed No evidence of active bacterial process - D/C all abx  Antibiotics: 5/13 vanc>>5/14 5/13 zoysn>>5/14 5/14 Cipro>>>5/19 5/19 Ceftriaxone >> 5/20    ENDOCRINE Documented hx of DM. Very mild hyperglycemia. Monitor off SSI. Check ACHS CBGs   NEUROLOGIC Chronic pain - Significant home opioid intake for back pain ( 360 mg morphine equivalent daily dose). At presentation likely had opioid neuro toxicity due to renal failure.  Delirium -  improving  - Resume outpt regimen (has been followed closely in pain clinic) - Cont PRN Haldol    BEST PRACTICE / DISPOSITION - Level of Care:  SDU - Primary Service:  PCCM - Consultants:  None - Code Status:  Full - Diet:  PO - DVT Px: LWMH - GI Px:  Not indicated    Billy Fischer, MD;  PCCM service; Mobile [phone]

## 2011-12-07 NOTE — Progress Notes (Signed)
Called by RN -patient complains of back pain.  On multiple high doses of oxycodone (30mg  IR QID PRN), MS contin (60 mg TID PRN) and lyrica.  Currently complaining of back pain.  RN was able to start PIV.    -Will d/c central line.  -Oxycodone IR 5mg  Q4 PRN -monitor  Canary Brim, NP-C [place] [facility] Pgr: [phone] or [phone]    Billy Fischer, MD;  PCCM service; Mobile [phone]

## 2011-12-08 LAB — GLUCOSE, CAPILLARY
Glucose-Capillary: 154 mg/dL — ABNORMAL HIGH (ref 70–99)
Glucose-Capillary: 157 mg/dL — ABNORMAL HIGH (ref 70–99)

## 2011-12-08 MED ORDER — SIMVASTATIN 20 MG PO TABS
20.0000 mg | ORAL_TABLET | Freq: Every evening | ORAL | Status: DC
  Administered 2011-12-08 – 2011-12-09 (×2): 20 mg via ORAL
  Filled 2011-12-08 (×3): qty 1

## 2011-12-08 MED ORDER — ENSURE COMPLETE PO LIQD
237.0000 mL | Freq: Two times a day (BID) | ORAL | Status: DC
  Administered 2011-12-08 (×2): 237 mL via ORAL

## 2011-12-08 NOTE — Progress Notes (Addendum)
Nutrition Follow-up  Extubated 5/20  Diet Hx:  Pt reports poor appetite prior to admit but ate well and maintained weight at about 225#.    Diet Order:  Regular  Current appetite poor with poor po,  Meds: Scheduled Meds:   . enoxaparin (LOVENOX) injection  40 mg Subcutaneous Q24H  . DISCONTD: antiseptic oral rinse  15 mL Mouth Rinse QID  . DISCONTD: chlorhexidine  15 mL Mouth Rinse BID   Continuous Infusions:   . sodium chloride 20 mL/hr at 12/06/11 0300   PRN Meds:.acetaminophen, albuterol, haloperidol lactate, oxyCODONE, DISCONTD: fentaNYL, DISCONTD: insulin aspart  Labs:  CMP     Component Value Date/Time   NA 140 12/06/2011 0420   K 3.7 12/06/2011 0420   CL 102 12/06/2011 0420   CO2 30 12/06/2011 0420   GLUCOSE 153* 12/06/2011 0420   BUN 19 12/06/2011 0420   CREATININE 0.73 12/06/2011 0420   CALCIUM 8.9 12/06/2011 0420   PROT 6.9 11/29/2011 1427   ALBUMIN 2.8* 11/29/2011 1427   AST 121* 11/29/2011 1427   ALT 39 11/29/2011 1427   ALKPHOS 95 11/29/2011 1427   BILITOT 0.5 11/29/2011 1427   GFRNONAA >90 12/06/2011 0420   GFRAA >90 12/06/2011 0420     Intake/Output Summary (Last 24 hours) at 12/08/11 1013 Last data filed at 12/08/11 0900  Gross per 24 hour  Intake    720 ml  Output    250 ml  Net    470 ml  I/O not accurate.  Pt incontinent at times.  Weight Status:   Wt Readings from Last 3 Encounters:  12/06/11 224 lb 13.9 oz (102 kg)    Re-estimated needs:  1900-2000 kcal, 130-150 g protein per day  Nutrition Dx:  Inadequate oral intake--ongoing  Goal:  Maximize intake with po meals and supplements to prevent weight loss.  Intervention:    Continue Regular diet  Add Ensure Complete bid- dislikes, will try carnation instant breakfast or shake with meals tid  Encouraged po and provide preferences  Monitor:  [phone]   Jeoffrey Massed Pager #:  [phone]

## 2011-12-08 NOTE — Clinical Social Work Note (Signed)
CSW had been following for support to family and possible needs at d/c. Plan appears for Pt to go home with wife who has been present during his hospitalization. CSW signing off as family and Pt coping well. No other CSW needs identified.  Vennie Homans, [place] 12/08/2011 11:46 AM [phone]

## 2011-12-08 NOTE — Progress Notes (Addendum)
Name: Xavier White MRN: [id number] DOB: 07/21/1945    Date of admit 11/29/2011 LOS: 9  Requesting WU:[id number] Regarding: Shock/hypoxia   PCCM NOTE  History of Present Illness: 66 y/o wm with chronic back pain and treated at local pain clinic (on minimum 360mg   Of morphine equivalent daily dose) but also on ace inhibitor. Family reports 24 hours of somnolence, confusion and low grade fever. Presented to [facility] ED 5/13 with 85/65 bp. Tx with narcan with abrupt arousal and complaints of pain. Creatine >6(on ace-i) Urine appeared dark and PCCM asked to admit.  Remained intubated until 5/20 at which time was liberated from mechanical ventilation.  Agitation / medication withdrawal contributed to difficulty weaning.      Lines / Drains: ETT 5/15>>> 5/20 L IJ TLC 5/15>>>5/16 L IJ TLC 5/16>>>5/21  Cultures: 5/13 bc x 2>>neg 5/13 uc>>neg 5/14 Sputum>>>few streptococcus, beta hemolyic not group A 5/13 MRSA swab>>>staph but no MRSA  Antibiotics: 5/13 vanc>>5/14 5/13 zoysn>>5/14 5/14 Cipro>>>5/20  Tests / Events: 5/13 somulent and hypotensive. Renal failure. Admitted 5/14 wide awake 5/15 severe withdrawal and agitation then developed respiratory failure. Intubated 5/20 Extubated, continued confusion 5/22 Remains stable, incontinent of urine / stool, periods of intermittent confusion   SUBJECTIVE/OVERNIGHT/INTERVAL HX Delirium continued but improved. Continues to be incontinent of stool / urine (using condom cath)   Vital Signs: Temp:  [98.4 F (36.9 C)-99.7 F (37.6 C)] 98.4 F (36.9 C) (05/22 0800) Pulse Rate:  [54-68] 59  (05/22 0900) Resp:  [19-31] 22  (05/22 0900) BP: (127-136)/(62-79) 131/64 mmHg (05/22 0730) SpO2:  [92 %-95 %] 95 % (05/22 0900) I/O last 3 completed shifts: In: 480 [P.O.:480] Out: 975 [Urine:975]   Intake/Output Summary (Last 24 hours) at 12/08/11 1046 Last data filed at 12/08/11 0900  Gross per 24 hour  Intake    720 ml  Output    250 ml  Net     470 ml   Physical Examination: General:  RASS 0 to +1. + F/C. Oriented to self, place, time. Neuro:  No focal deficits.  HEENT:  WNL Cardiovascular:  hsr rrr Lungs: Clear anteriorly Abdomen:  Soft, NT, NABS Ext: no edema GU:  Erythema to peri area   Labs and Imaging:   Lab 12/06/11 0420 12/05/11 1100 12/04/11 0350  NA 140 140 140  K 3.7 3.9 3.7  CL 102 102 98  CO2 30 31 34*  BUN 19 19 17   CREATININE 0.73 0.77 0.83  GLUCOSE 153* 145* 182*    Lab 12/04/11 0350 12/03/11 0411 12/02/11 0325  HGB 12.6* 13.1 11.9*  HCT 38.8* 39.8 35.8*  WBC 6.5 4.2 6.7  PLT 132* 100* 109*   ABG    Component Value Date/Time   PHART 7.424 12/04/2011 1305      Principal Problem:  *Acute respiratory failure Active Problems:  Hypotension  Renal failure  Sepsis  Encephalopathy acute  Chronic back pain  Opioid dependence   ASSESSMENT AND PLAN  PULMONARY  CXR:  No new cxr   A:   Acute Respiratory failure since 12/01/11, 2 days post admit due delirium due to opioid withdrawal. Extubated 5/20 - tolerating well P:   -Cont post extubation respiratory care -Mobilize / PT  CARDIOVASCULAR -no acute issues Hx of Hyperlipidemia  Lines:  L IJ TLC 5/15>>>5/16              L IJ TLC 5/16>> 5/20  P: -monitor -resume zocor  RENAL  Intake/Output      05/21 0701 -  05/22 0700 05/22 0701 - 05/23 0700   P.O. 480 240   I.V. (mL/kg)     NG/GT     Total Intake(mL/kg) 480 (4.7) 240 (2.4)   Urine (mL/kg/hr) 425 (0.2)    Total Output 425    Net +55 +240        Urine Occurrence 1 x    Stool Occurrence 6 x     D/C Foley:    A:  Renal failure at admission 5/13 (in setting of ACE inhibitor). Resolved by 5/15. No acute issues since 5/15 P:  -monitor sr cr -consider alternate agent when BP medications resumed.  Currently normotensive.   GASTROINTESTINAL P: -advanced diet as tolerated -check stool for C-diff -add yogurt to diet [place] -peri barrier  cream    INFECTIOUS  Cultures:  All micro reviewed No evidence of active bacterial process - D/C all abx  Antibiotics: 5/13 vanc>>5/14 5/13 zoysn>>5/14 5/14 Cipro>>>5/19 5/19 Ceftriaxone >> 5/20    ENDOCRINE A: Hx of DM P:.  -Monitor off SSI.  -Check ACHS CBGs -Consider resume home metformin   NEUROLOGIC Chronic pain - Significant home opioid intake for back pain (360 mg morphine equivalent daily dose). At presentation likely had opioid neuro toxicity due to renal failure.  Delirium - improving P: -Off outpatient regimen currently -Tolerating PRN oxy IR 5mg  with good pain control -Followed by Pain clinic as outpatient, will need to determine restart doses if any.  Would cautiously restart at much lower does but currently does not need.  - Cont PRN Haldol -Consider resume home lyrica at reduced dose    BEST PRACTICE / DISPOSITION - Level of Care:  Transfer to Medical Floor - Primary Service:  PCCM-->transfer to [facility] 6 am of 5/23 0700 - Consultants:  PT - Code Status:  Full - Diet:  PO - DVT Px: LWMH - GI Px:  Not indicated    Canary Brim, NP-C [facility] Pgr: [phone] or [phone]    Seen on CCM rounds this morning with resident MD or ACNP above.  Pt examined and database reviewed. I agree with above findings, assessment and plan as reflected in the note above. Looks great. Transfer out of ICU and to [facility] service  Billy Fischer, MD;  PCCM service; Mobile [phone]

## 2011-12-09 DIAGNOSIS — R4182 Altered mental status, unspecified: Secondary | ICD-10-CM

## 2011-12-09 DIAGNOSIS — I10 Essential (primary) hypertension: Secondary | ICD-10-CM

## 2011-12-09 DIAGNOSIS — G8929 Other chronic pain: Secondary | ICD-10-CM

## 2011-12-09 DIAGNOSIS — N179 Acute kidney failure, unspecified: Secondary | ICD-10-CM

## 2011-12-09 LAB — BLOOD GAS, ARTERIAL
Acid-Base Excess: 8.2 mmol/L — ABNORMAL HIGH (ref 0.0–2.0)
Bicarbonate: 33 mEq/L — ABNORMAL HIGH (ref 20.0–24.0)
FIO2: 0.4 %
MECHVT: 500 mL
TCO2: 29.3 mmol/L (ref 0–100)
pCO2 arterial: 47.4 mmHg — ABNORMAL HIGH (ref 35.0–45.0)
pH, Arterial: 7.457 — ABNORMAL HIGH (ref 7.350–7.450)

## 2011-12-09 LAB — GLUCOSE, CAPILLARY: Glucose-Capillary: 109 mg/dL — ABNORMAL HIGH (ref 70–99)

## 2011-12-09 MED ORDER — AMLODIPINE BESYLATE 5 MG PO TABS
5.0000 mg | ORAL_TABLET | Freq: Every day | ORAL | Status: DC
  Administered 2011-12-09: 5 mg via ORAL
  Filled 2011-12-09 (×2): qty 1

## 2011-12-09 MED ORDER — PREGABALIN 25 MG PO CAPS
25.0000 mg | ORAL_CAPSULE | Freq: Two times a day (BID) | ORAL | Status: DC
  Administered 2011-12-09 (×2): 25 mg via ORAL
  Filled 2011-12-09 (×2): qty 1

## 2011-12-09 NOTE — Progress Notes (Signed)
Pt has unsteady gate and is educated about fall safety and the bed alarm is turned on.  Pt is still noncompliant and continues to get up out of bed on own.  Will continue to monitor pt. Daphene Calamity [place] 5:46 AM 12/09/2011

## 2011-12-09 NOTE — Progress Notes (Signed)
Subjective: Patient alert. Patient c/o back pain. Patient denies any diarrhea.  Objective: Vital signs in last 24 hours: Filed Vitals:   12/08/11 1825 12/08/11 2039 12/09/11 0500 12/09/11 0915  BP: 127/78 122/70 148/79   Pulse: 59 53 60   Temp: 99.2 F (37.3 C) 98.5 F (36.9 C) 98.3 F (36.8 C)   TempSrc: Oral Oral Oral   Resp: 20 20 19    Height:      Weight:      SpO2: 92% 92% 92% 95%    Intake/Output Summary (Last 24 hours) at 12/09/11 1146 Last data filed at 12/09/11 0500  Gross per 24 hour  Intake    480 ml  Output      0 ml  Net    480 ml    Weight change:   General: Alert, awake, oriented x3, in no acute distress. HEENT: No bruits, no goiter. Heart: Regular rate and rhythm, without murmurs, rubs, gallops. Lungs: Clear to auscultation bilaterally. Abdomen: Soft, nontender, nondistended, positive bowel sounds. Extremities: No clubbing cyanosis or edema with positive pedal pulses. Neuro: Grossly intact, nonfocal.    Lab Results: No results found for this basename: NA:2,K:2,CL:2,CO2:2,GLUCOSE:2,BUN:2,CREATININE:2,CALCIUM:2,MG:2,PHOS:2 in the last 72 hours No results found for this basename: AST:2,ALT:2,ALKPHOS:2,BILITOT:2,PROT:2,ALBUMIN:2 in the last 72 hours No results found for this basename: LIPASE:2,AMYLASE:2 in the last 72 hours No results found for this basename: WBC:2,NEUTROABS:2,HGB:2,HCT:2,MCV:2,PLT:2 in the last 72 hours No results found for this basename: CKTOTAL:3,CKMB:3,CKMBINDEX:3,TROPONINI:3 in the last 72 hours No components found with this basename: POCBNP:3 No results found for this basename: DDIMER:2 in the last 72 hours No results found for this basename: HGBA1C:2 in the last 72 hours No results found for this basename: CHOL:2,HDL:2,LDLCALC:2,TRIG:2,CHOLHDL:2,LDLDIRECT:2 in the last 72 hours No results found for this basename: TSH,T4TOTAL,FREET3,T3FREE,THYROIDAB in the last 72 hours No results found for this basename:  VITAMINB12:2,FOLATE:2,FERRITIN:2,TIBC:2,IRON:2,RETICCTPCT:2 in the last 72 hours  Micro Results: Recent Results (from the past 240 hour(s))  MRSA PCR SCREENING     Status: Abnormal   Collection Time   11/29/11  5:56 PM      Component Value Range Status Comment   MRSA by PCR INVALID RESULTS, SPECIMEN SENT FOR CULTURE (*) NEGATIVE  Final   MRSA CULTURE     Status: Normal   Collection Time   11/29/11  5:56 PM      Component Value Range Status Comment   Specimen Description NOSE   Final    Special Requests NONE   Final    Culture     Final    Value: STAPHYLOCOCCUS AUREUS     Note: NO MRSA ISOLATED   Report Status 12/01/2011 FINAL   Final   CULTURE, RESPIRATORY     Status: Normal   Collection Time   11/30/11  3:41 AM      Component Value Range Status Comment   Specimen Description TRACHEAL ASPIRATE   Final    Special Requests Normal   Final    Gram Stain     Final    Value: NO WBC SEEN     ABUNDANT SQUAMOUS EPITHELIAL CELLS PRESENT     RARE GRAM POSITIVE COCCI IN PAIRS     FEW GRAM POSITIVE RODS   Culture FEW STREPTOCOCCUS,BETA HEMOLYIC NOT GROUP A   Final    Report Status 12/02/2011 FINAL   Final     Studies/Results: No results found.  Medications:     . amLODipine  5 mg Oral Daily  . enoxaparin (LOVENOX) injection  40 mg Subcutaneous  Q24H  . feeding supplement  237 mL Oral BID BM  . pregabalin  25 mg Oral BID  . simvastatin  20 mg Oral QPM    Assessment: Principal Problem:  *Acute respiratory failure Active Problems:  Hypotension  Renal failure  Sepsis  Encephalopathy acute  Chronic back pain  Opioid dependence   Plan: #1 acute respiratory failure since 12/01/2011 Secondary to acute delirium secondary to opioid withdrawal. Status post extubation 12/06/2011. Patient denies any shortness of breath. Stable.  #2 acute renal failure Likely secondary to prerenal azotemia in the setting of ACE inhibitor. Resolved. Patient's ACE inhibitor has been discontinued. We'll  start patient on Norvasc for BP control. Repeat be met in the a.m.  #3 acute encephalopathy Secondary to narcotic-induced. Patient is status post Narcan. Clinical improvement. Patient is alert and onto x3. Patient is of outpatient pain regimen. Continue oxycodone 5 mg by mouth every 4 hours when necessary. Will resume patient's Lyrica at half his home dose. Haldol as needed.  #4 chronic pain Patient's outpatient regimen has been discontinued secondary to problem #3. Patient is currently on oxycodone 5 mg by mouth every 4 hours when necessary breakthrough pain. We'll resume patient's Lyrica at half his home dose. Patient will need to followup with his pain MD. as outpatient.  #5 hypertension  patient was on ACE inhibitor prior to admission however secondary to his acute renal failure this was discontinued. Will start patient on Norvasc 5 mg daily and titrate as needed. Patient will need to followup with his PCP as outpatient.   LOS: 10 days   Xavier White [phone] 12/09/2011, 11:46 AM

## 2011-12-09 NOTE — Progress Notes (Signed)
Pt refused iv access.  Provider notified and gave verbal order no iv access ok.  Will continue to monitor pt. Daphene Calamity [place] 5:51 AM 12/09/2011

## 2011-12-09 NOTE — Evaluation (Signed)
Physical Therapy Evaluation Patient Details Name: Xavier White MRN: [id number] DOB: November 05, 1945 Today's Date: 12/09/2011 Time: 0102-7253 PT Time Calculation (min): 38 min  PT Assessment / Plan / Recommendation Clinical Impression  66 yo male with 10 day hospitalization including 5 days on a ventilator admitted for acute respiratory failure.  He has history of chronic back pain and use of pain medicine.  Today he was cooperative with PT evaluation and able to be independent in bed mobility, but does have high risk of falling if walks without assistive device. He was able to do well with RW and will need acute PT to continue education with pt and wife about home exercise and safety with RW on levels and stairs.  Pt would benefit from outpatient PT at d/c.  Wife says there is a RW for him to use at home.    PT Assessment  Patient needs continued PT services    Follow Up Recommendations  Outpatient PT    Barriers to Discharge        lEquipment Recommendations  None recommended by PT    Recommendations for Other Services OT consult   Frequency Min 3X/week    Precautions / Restrictions Precautions Precautions: Fall Restrictions Weight Bearing Restrictions: No   Pertinent Vitals/Pain Pt with c/o back pain about 4-5/10 throughout session, but he was able to continue      Mobility  Bed Mobility Bed Mobility: Rolling Right;Rolling Left;Right Sidelying to Sit;Left Sidelying to Sit;Supine to Sit;Sitting - Scoot to [place] of Bed;Sit to Supine Rolling Right: 7: Independent Rolling Left: 7: Independent Right Sidelying to Sit: 7: Independent Left Sidelying to Sit: 7: Independent Supine to Sit: 7: Independent Sitting - Scoot to Edge of Bed: 7: Independent Sit to Supine: 7: Independent Details for Bed Mobility Assistance: pt uses log rolling technique Transfers Transfers: Sit to Stand;Stand to Sit Sit to Stand: 7: Independent Stand to Sit: 7:  Independent Ambulation/Gait Ambulation/Gait Assistance: 4: Min assist Ambulation Distance (Feet): 100 Feet Assistive device: Rolling walker;Straight cane Ambulation/Gait Assistance Details: assist for balance, especially on turns or when pt is distracted,  He lost balance with use of straight cane and had to go to RW.  He did better with this device, but still leaves it behind when getting close to chair to sit down Gait Pattern: Step-through pattern;Trunk flexed;Ataxic Gait velocity: WFL General Gait Details: pt maintains trunk flexion and is unable to extend tunk or activate  core to assist with stability. He does not attend well to safety while walking and balance decreases with fatigue Stairs: Yes Stairs Assistance: 3: Mod assist Stairs Assistance Details (indicate cue type and reason): attempted to do stairs, but pt was fatigued and could not attend to safety Stair Management Technique: One rail Right Number of Stairs: 1  Wheelchair Mobility Wheelchair Mobility: No    Exercises     PT Diagnosis: Abnormality of gait;Generalized weakness  PT Problem List: Decreased balance;Decreased activity tolerance;Decreased knowledge of use of DME;Decreased safety awareness;Pain PT Treatment Interventions: DME instruction;Gait training;Stair training;Functional mobility training;Therapeutic exercise;Patient/family education;Balance training   PT Goals Acute Rehab PT Goals PT Goal Formulation: With patient/family Time For Goal Achievement: 12/16/11 Potential to Achieve Goals: Good Pt will Ambulate: >150 feet;with modified independence;with least restrictive assistive device PT Goal: Ambulate - Progress: Goal set today Pt will Go Up / Down Stairs: 3-5 stairs;with least restrictive assistive device;with supervision PT Goal: Up/Down Stairs - Progress: Goal set today Pt will Perform Home Exercise Program: with supervision, verbal cues required/provided  PT Goal: Perform Home Exercise Program -  Progress: Goal set today  Visit Information  Last PT Received On: 12/09/11 Assistance Needed: +1    Subjective Data  Subjective: ppt states he wants to go home today Patient Stated Goal: to go home with wife   Prior Functioning  Home Living Lives With: Family Available Help at Discharge: Family Type of Home: House Home Access: Stairs to enter Secretary/administrator of Steps: 5 Entrance Stairs-Rails: Right;Left Home Layout: One level Home Adaptive Equipment: Straight cane;Walker - rolling (wife says equipment is available through extended family) Prior Function Level of Independence: Independent Able to Take Stairs?: Yes Communication Communication: No difficulties    Cognition  Overall Cognitive Status: Appears within functional limits for tasks assessed/performed Arousal/Alertness: Awake/alert Orientation Level: Appears intact for tasks assessed Behavior During Session: [facility] for tasks performed (needs direction or can be easily agitated) Cognition - Other Comments: pt can become angry/agitated if focused on pain or medicines, but is easily redirected and cooperative with PT until he gets fatigued    Extremity/Trunk Assessment Right Lower Extremity Assessment RLE ROM/Strength/Tone: WFL for tasks assessed RLE Sensation: History of peripheral neuropathy;WFL - Light Touch;WFL - Proprioception RLE Coordination: WFL - gross/fine motor Left Lower Extremity Assessment LLE ROM/Strength/Tone: WFL for tasks assessed LLE Sensation: WFL - Light Touch;WFL - Proprioception;History of peripheral neuropathy LLE Coordination: WFL - gross/fine motor Trunk Assessment Trunk Assessment: Other exceptions Trunk Exceptions: pt with multiple scars on back from previous sugeries and has faint resolving eccymosis at low back/sacral area from reported fall off a ladder. He had decreased trunk muscle tone with anterior abdomen.  Decreased rotation of spine with pt reluctant to even try   Balance  Balance Balance Assessed: Yes Static Sitting Balance Static Sitting - Balance Support: No upper extremity supported;Feet supported Static Sitting - Level of Assistance: 7: Independent Static Sitting - Comment/# of Minutes: 5 Standardized Balance Assessment Standardized Balance Assessment: Berg Balance Test Berg Balance Test Sit to Stand: Able to stand without using hands and stabilize independently Standing Unsupported: Able to stand safely 2 minutes Sitting with Back Unsupported but Feet Supported on Floor or Stool: Able to sit safely and securely 2 minutes Stand to Sit: Sits safely with minimal use of hands Transfers: Able to transfer safely, minor use of hands Standing Unsupported with Eyes Closed: Able to stand 10 seconds with supervision Standing Ubsupported with Feet Together: Able to place feet together independently but unable to hold for 30 seconds From Standing, Reach Forward with Outstretched Arm: Can reach confidently >25 cm (10") From Standing Position, Pick up Object from Floor: Able to pick up shoe safely and easily From Standing Position, Turn to Look Behind Over each Shoulder: Needs assist to keep from losing balance and falling Turn 360 Degrees: Able to turn 360 degrees safely but slowly Standing Unsupported, Alternately Place Feet on Step/Stool: Able to stand independently and complete 8 steps >20 seconds Standing Unsupported, One Foot in Front: Loses balance while stepping or standing Standing on One Leg: Unable to try or needs assist to prevent fall Total Score: 38   End of Session PT - End of Session Activity Tolerance: Patient limited by fatigue Patient left: in chair;with chair alarm set;with family/visitor present Nurse Communication: Mobility status   Donnetta Hail 12/09/2011, 9:41 AM

## 2011-12-10 DIAGNOSIS — E876 Hypokalemia: Secondary | ICD-10-CM

## 2011-12-10 LAB — BASIC METABOLIC PANEL
CO2: 25 mEq/L (ref 19–32)
Calcium: 8.9 mg/dL (ref 8.4–10.5)
Creatinine, Ser: 0.84 mg/dL (ref 0.50–1.35)
GFR calc non Af Amer: 90 mL/min — ABNORMAL LOW (ref >90)
Glucose, Bld: 112 mg/dL — ABNORMAL HIGH (ref 70–99)
Sodium: 139 mEq/L (ref 135–145)

## 2011-12-10 LAB — GLUCOSE, CAPILLARY: Glucose-Capillary: 135 mg/dL — ABNORMAL HIGH (ref 70–99)

## 2011-12-10 MED ORDER — POTASSIUM CHLORIDE CRYS ER 20 MEQ PO TBCR
40.0000 meq | EXTENDED_RELEASE_TABLET | ORAL | Status: DC
  Filled 2011-12-10 (×2): qty 2

## 2011-12-10 NOTE — Discharge Summary (Signed)
Discharge Summary  Patient left AMA Xavier White Wearing MR#: [id number]  DOB:1945-08-18  Date of Admission: 11/29/2011 Date of Discharge: 12/10/2011  Patient's PCP: Dorrene German, MD, MD  Attending Physician:Layali Freund  Consults:   #1: Critical care medicine. Dr. Molli Knock  Discharge Diagnoses: Acute respiratory failure Present on Admission:  .Pain radiating to back .Hypotension .Renal failure .Sepsis .Respiratory failure .Acute respiratory failure .Encephalopathy acute .Chronic back pain .Opioid dependence   Brief Admitting History and Physical 66 y/o wm with chronic back pain and treated at local pain clinic (on minimum 360mg  Of morphine equivalent daily dose) but also on ace inhibitor. Family reports 24 hours of somnolence, confusion and low grade fever. Presented to [facility] ED 5/13 with 85/65 bp. Tx with narcan with abrupt arousal and complaints of pain. Creatine >6(on ace-i) Urine appeared dark and PCCM asked to admit. Remained intubated until 5/20 at which time was liberated from mechanical ventilation. Agitation / medication withdrawal contributed to difficulty weaning. The rest of admission history and physical please see H&P dictated by Dr. Molli Knock.   Discharge Medications Medication List  As of 12/10/2011  7:20 PM   ASK your doctor about these medications         esomeprazole 40 MG capsule   Commonly known as: NEXIUM      lisinopril 40 MG tablet   Commonly known as: PRINIVIL,ZESTRIL      metFORMIN 500 MG tablet   Commonly known as: GLUCOPHAGE      morphine 60 MG 12 hr tablet   Commonly known as: MS CONTIN      oxycodone 30 MG immediate release tablet   Commonly known as: ROXICODONE      pregabalin 75 MG capsule   Commonly known as: LYRICA      simvastatin 20 MG tablet   Commonly known as: [facility]           Hospital Course: #1 Acute respiratory failure Patient was admitted to the critical care service. 2 days post admission patient was intubated  secondary to delirium due to opioid withdrawal. Patient was in the critical care service at that time. Patient was subsequently extubated on 12/06/2010 with respiratory care. Patient was mobilized with PT patient continued to improve clinically. Patient was subsequently transferred to the medical floor and transferred to the hospitalist service. Patient remained in stable condition he had good oxygenation and patient subsequently left AGAINST MEDICAL ADVICE on 12/10/2011.  #2 acute renal failure On admission patient was noted to be in acute renal failure with a creatinine of approximately 6. It was felt this was likely secondary to a prerenal azotemia in the setting of ACE inhibitor. Patient's ACE inhibitor was discontinued. Patient was hydrated with IV fluids with daily improvement in his renal function. Patient's renal function improved on a daily basis such that by day of discharge his acute renal failure had resolved. Patient left against medical advise prior to discharge and a such could not be instructed to discontinue his ACE inhibitor. Hopefully patient will followup with his PCP as outpatient.   #3 acute encephalopathy Patient was admitted with altered mental status and lethargic. Patient was given Narcan with improvement in his lethargy. Was felt patient's acute encephalopathy was narcotic induced. Patient was on wall. Doses of pain medication as outpatient for his chronic back pain. Patient's pain regimen was discontinued during the hospitalization. As patient improved and his clinical status improved and he was alert and oriented he was resumed back on oxycodone 5 mg every 4 hours as needed  for breakthrough pain. Patient's long acting MS Contin was not resumed. Patient's Lyrica was resumed at half his home dose. Patient was maintained on Haldol during the hospitalization for his agitation and combativeness. Patient's pain seemed to be controlled on oxycodone 5 mg every 4 hours when necessary for  breakthrough pain as well as Lyrica. As of today patient left patient's acute encephalopathy had resolved and he was back to his baseline.  #4 hypertension On admission patient was noted to be in acute renal failure with a creatinine approximately 6. Patient had been on ACE inhibitor and was discontinued. Patient's blood pressure remained stable during most of the hospitalization. One day prior to discharge patient was started on Norvasc 5 mg daily. However patient left AGAINST MEDICAL ADVICE prior to instructions being given on his antihypertensive regimen. Hopefully patient will followup with his PCP as outpatient.  #5?? Sepsis On admission patient was noted to be hypotensive and it was felt that patient might be septic. Patient was initially admitted to the critical care team. Patient was placed in the ICU and placed on the sepsis protocol. Patient was pan cultured and placed empirically on IV vancomycin Zosyn and ciprofloxacin. Patient was pan cultured. Blood cultures were negative x2. Urine cultures were negative. Sputum culture  grew Streptococcus beta-hemolytic not group A. Patient's antibiotics were discontinued and patient remained afebrile.  Patient left AGAINST MEDICAL ADVICE prior to being discharged in a such was not discharged on any medications. Hopefully patient will followup with his PCP as outpatient for further management.  Present on Admission:  .Pain radiating to back .Hypotension .Renal failure .Sepsis .Respiratory failure .Acute respiratory failure .Encephalopathy acute .Chronic back pain .Opioid dependence   Day of Discharge BP 135/70  Pulse 64  Temp(Src) 99.1 F (37.3 C) (Oral)  Resp 18  Ht 5\' 9"  (1.753 m)  Wt 102 kg (224 lb 13.9 oz)  BMI 33.21 kg/m2  SpO2 92% Patient left AGAINST MEDICAL ADVICE prior to being seen. Results for orders placed during the hospital encounter of 11/29/11 (from the past 48 hour(s))  GLUCOSE, CAPILLARY     Status: Abnormal    Collection Time   12/09/11  8:11 AM      Component Value Range Comment   Glucose-Capillary 109 (*) 70 - 99 (mg/dL)   BASIC METABOLIC PANEL     Status: Abnormal   Collection Time   12/10/11  3:45 AM      Component Value Range Comment   Sodium 139  135 - 145 (mEq/L)    Potassium 3.1 (*) 3.5 - 5.1 (mEq/L)    Chloride 102  96 - 112 (mEq/L)    CO2 25  19 - 32 (mEq/L)    Glucose, Bld 112 (*) 70 - 99 (mg/dL)    BUN 15  6 - 23 (mg/dL)    Creatinine, Ser 4.09  0.50 - 1.35 (mg/dL)    Calcium 8.9  8.4 - 10.5 (mg/dL)    GFR calc non Af Amer 90 (*) >90 (mL/min)    GFR calc Af Amer >90  >90 (mL/min)   MAGNESIUM     Status: Normal   Collection Time   12/10/11  3:45 AM      Component Value Range Comment   Magnesium 1.8  1.5 - 2.5 (mg/dL)   GLUCOSE, CAPILLARY     Status: Abnormal   Collection Time   12/10/11  8:46 AM      Component Value Range Comment   Glucose-Capillary 135 (*) 70 - 99 (mg/dL)  Dg Chest 2 View  11/29/2011  *RADIOLOGY REPORT*  Clinical Data: Weakness, nausea, vomiting, seizure-like activity this morning  CHEST - 1 VIEW  Comparison:  08/21/2003  Findings:  Normal cardiac silhouette and mediastinal contours.  Lung volumes remain persistently reduced with perihilar heterogeneous opacities. There is persistent mild elevation of the right hemidiaphragm.  No focal airspace opacities.  No pleural effusion or pneumothorax. Spinal stimulator overlies the lower thoracic spine.  Post cholecystectomy.  IMPRESSION: Decreased lung volumes without acute cardiopulmonary disease.  Original Report Authenticated By: Waynard Reeds, M.D.   Ct Head Wo Contrast  11/29/2011  *RADIOLOGY REPORT*  Clinical Data:  Pain.  Confusion.  Fall episodes.  CT HEAD WITHOUT CONTRAST  Technique: Contiguous axial images were obtained from the base of the skull through the vertex without intravenous contrast.  Comparison:   None.  Findings:  No mass effect, midline shift, or acute intracranial hemorrhage.  Mastoid air  cells are clear.  Near complete opacification of the anterior ethmoid air cells and right frontal sinus is noted.  No obvious acute bony deformity.  No cranial fracture.  Mild global atrophy appropriate to age.  IMPRESSION:  No acute intracranial pathology.  Chronic-appearing inflammatory changes in the paranasal sinuses.  Jolaine Click, M.D.  Original Report Authenticated By: Donavan Burnet, M.D.   Ct Lumbar Spine Wo Contrast  11/29/2011  *RADIOLOGY REPORT*  Clinical Data: Fall.  Pain.  Altered mental status.  Previous surgery.  CT LUMBAR SPINE WITHOUT CONTRAST  Technique:  Multidetector CT imaging of the lumbar spine was performed without intravenous contrast administration. Multiplanar CT image reconstructions were also generated.  Comparison: MRI 12/25/2008.  CT 06/26/2008.  Findings: T12-L1:  Unremarkable interspace.  Dorsal neurostimulator extends into the thoracic region.  L1-2:  Disc degeneration with mild disc space narrowing and circumferential bulging of the disc.  No compressive stenosis.  L2-3:  Retrolisthesis of 3 mm.  Disc degeneration with vacuum phenomenon and circumferential protrusion of disc material. Moderate stenosis at this level.  L3-3 sacrum:  Previous fusion with pedicle screws and posterior rods.  Fusion appears solid.  No evidence of screw loosening or motion.  Canal and foramina appear sufficiently patent.  Sacroiliac joints show degenerative arthritis bilaterally.  IMPRESSION: Solid fusion from L3 to the sacrum.  Sufficient patency of the canal and foramina in that region.  Adjacent segment degenerative disease at L2-3.  Retrolisthesis of 3 mm.  Degeneration of the disc with vacuum phenomenon and circumferential protrusion.  Moderate stenosis at this level that could be symptomatic.  Original Report Authenticated By: Thomasenia Sales, M.D.   US Renal  11/29/2011  *RADIOLOGY REPORT*  Clinical Data: Elevated creatinine.  RENAL/URINARY TRACT ULTRASOUND COMPLETE  Comparison:  CT  06/26/2008.  Findings:  Right Kidney:  11.8 cm. Normal size and echotexture.  No focal abnormality.  No hydronephrosis.  Left Kidney:  12.6 cm. Normal size and echotexture.  No focal abnormality.  No hydronephrosis.  Bladder:  Bladder decompressed and not visualized.  IMPRESSION: No acute renal abnormality.  No hydronephrosis.  Original Report Authenticated By: Cyndie Chime, M.D.   Dg Chest Port 1 View  12/06/2011  *RADIOLOGY REPORT*  Clinical Data: Central line placement  PORTABLE CHEST - 1 VIEW  Comparison: Earlier film of the same day  Findings: Right IJ central line extends to the proximal SVC. No pneumothorax.  The patient has been extubated and the nasogastric tube removed.  Dorsal thoracic stimulator leads stable.  Orthopedic anchors in the right  humeral head.  The lungs are clear.  No effusion.  Heart size upper limits normal for technique.  IMPRESSION:  1.  Central line to proximal SVC without pneumothorax.  Original Report Authenticated By: Osa Craver, M.D.   Dg Chest Port 1 View  12/06/2011  *RADIOLOGY REPORT*  Clinical Data: Respiratory failure.  PORTABLE CHEST - 1 VIEW  Comparison: 12/04/2011.  Findings: Endotracheal tube is in satisfactory position. Nasogastric tube is followed into the stomach.  Right IJ central line tip projects over the SVC.  Stimulator wires project over the lower thoracic spine.  Lungs are somewhat low in volume with mild bibasilar atelectasis. Heart size normal.  No pleural fluid.  IMPRESSION: Low lung volumes with mild bibasilar atelectasis.  Original Report Authenticated By: Reyes Ivan, M.D.   Dg Chest Port 1 View  12/04/2011  *RADIOLOGY REPORT*  Clinical Data: 66 year old male with low grade fever and on ventilator.  PORTABLE CHEST - 1 VIEW  Comparison: 12/04/2011 and prior chest radiographs  Findings: An endotracheal tube is again identified with tip 5 cm above the carina, and NG tube entering the stomach with tip off the field of view, a right IJ  central venous catheter with tip overlying the upper SVC, and thoracic spinal neurostimulator Mild peribronchial thickening is noted. There is no evidence of focal airspace disease, pulmonary edema, suspicious pulmonary nodule/mass, pleural effusion, or pneumothorax. No acute bony abnormalities are identified.  IMPRESSION:  Stable chest radiograph with support apparatus as described.  Original Report Authenticated By: Rosendo Gros, M.D.   Dg Chest Port 1 View  12/04/2011  *RADIOLOGY REPORT*  Clinical Data: Evaluate endotracheal tube positioning  PORTABLE CHEST - 1 VIEW  Comparison: 12/03/2011; 12/02/2011; 12/01/2011  Findings:  Grossly unchanged cardiac silhouette and mediastinal contours. Stable positioning of support apparatus.  Lung volumes remain persistently reduced.  There is mild pulmonary venous congestion without frank evidence of pulmonary edema.  Persistent mild elevation of the right hemidiaphragm.  No focal airspace opacities. No definite pleural effusions.  Grossly unchanged bones. Spinal stimulator overlies the lower thoracic spine.  IMPRESSION: 1.  Stable positioning of support apparatus.  No pneumothorax. 2.  Mild pulmonary venous congestion without frank evidence of pulmonary edema.  Original Report Authenticated By: Waynard Reeds, M.D.   Dg Chest Port 1 View  12/03/2011  *RADIOLOGY REPORT*  Clinical Data: 66 year old male with atelectasis.  Endotracheal tube placement.  PORTABLE CHEST - 1 VIEW  Comparison: 12/02/2011 and earlier.  Findings: Portable semi upright AP view 0435 hours.  Endotracheal tube remains in good position at the level of clavicles.  Enteric tube courses to the abdomen, tip not included.  Stable right IJ central line.  Thoracic spinal stimulator device re-identified. Slightly improved lung volumes and ventilation at the lung bases. No pneumothorax, pulmonary edema, definite effusion or confluent pulmonary opacity.  IMPRESSION: 1. Stable lines and tubes. 2.  Improved  bibasilar ventilation, no acute cardiopulmonary abnormality.  Original Report Authenticated By: Harley Hallmark, M.D.   Dg Chest Port 1 View  12/02/2011  *RADIOLOGY REPORT*  Clinical Data: Central line placement.  PORTABLE CHEST - 1 VIEW  Comparison: 12/02/2011  Findings: Right central line is in place with the tip in the SVC. No pneumothorax.  Endotracheal tube and NG tube are unchanged. Heart is normal size.  Bibasilar atelectasis again noted, stable. Mild peribronchial thickening and interstitial prominence. Possible small effusions.  IMPRESSION: Right central line tip in the SVC.  No pneumothorax.  Stable bibasilar  atelectasis and bronchitic changes.  Question small effusions.  Original Report Authenticated By: Cyndie Chime, M.D.   Dg Chest Port 1 View  12/02/2011  *RADIOLOGY REPORT*  Clinical Data: Endotracheal tube position.  Respiratory difficulty.  PORTABLE CHEST - 1 VIEW  Comparison: Yesterday  Findings: Endotracheal tube tip remains 3.0 cm from the carina. Stable NG tube beyond the gastroesophageal junction.  Spinal cord stimulator projects over the lower thoracic spine.  Low volumes. Bibasilar atelectasis increased left greater than right.  Vascular congestion.  No sign of interstitial edema.  IMPRESSION: Stable endotracheal tube.  Bibasilar atelectasis left greater than right has worsened.  Original Report Authenticated By: Donavan Burnet, M.D.   Dg Chest Port 1 View  12/01/2011  *RADIOLOGY REPORT*  Clinical Data: Check tube placement.  PORTABLE CHEST - 1 VIEW  Comparison: 12/01/2011.  Findings: Endotracheal tube terminates approximately 2.7 cm above carina.  Left IJ central line tip projects over the SVC.  Heart size normal.  Lungs are somewhat low in volume with mild interstitial prominence.  No definite pleural fluid.  IMPRESSION: Low lung volumes with probable mild vascular crowding.  Original Report Authenticated By: Reyes Ivan, M.D.   Dg Chest Port 1 View  12/01/2011  *RADIOLOGY  REPORT*  Clinical Data: Status post intubation and central line placement.  PORTABLE CHEST - 1 VIEW  Comparison: Chest x-ray 12/01/2011.  Findings: An endotracheal tube is in place with tip 5.0 cm above the carina. There is a left-sided internal jugular central venous catheter with tip terminating in the proximal superior vena cava. Lung volumes are low.  No definite consolidative airspace disease. Linear opacity in the right lower lobe likely reflects subsegmental atelectasis.  No definite pleural effusions.  Pulmonary venous congestion (likely accentuated by the low lung volumes) without frank pulmonary edema.  Heart size is normal. The patient is rotated to the right on today's exam, resulting in distortion of the mediastinal contours and reduced diagnostic sensitivity and specificity for mediastinal pathology.  Atherosclerotic calcifications within the arch of the aorta.  IMPRESSION: 1.  Support apparatus, as above. 2.  Low lung volumes with probable right lower lobe subsegmental atelectasis. 3.  Atherosclerosis.  Original Report Authenticated By: Florencia Reasons, M.D.   Dg Chest Port 1 View  12/01/2011  *RADIOLOGY REPORT*  Clinical Data: Sepsis  PORTABLE CHEST - 1 VIEW  Comparison: 11/30/2011; 11/29/2011; 08/21/2003  Findings:  Grossly unchanged cardiac silhouette and mediastinal contours given persistently reduced lung volumes.  Examination is degraded secondary exclusion of the right lung apex.  There is mild pulmonary venous congestion without frank evidence of pulmonary edema.  Perihilar heterogeneous opacities, right greater than left. Persistent mild elevation of the right hemidiaphragm.  No definite pneumothorax.  Grossly unchanged bones.  Spinal stimulator overlies the lower thoracic spine.  IMPRESSION: 1.  Mild pulmonary venous congestion without frank evidence of pulmonary edema. 2.  Persistently reduced lung volumes with perihilar opacities, right greater than left, possibly atelectasis.   Original Report Authenticated By: Waynard Reeds, M.D.   Portable Chest Xray In Am  11/30/2011  *RADIOLOGY REPORT*  Clinical Data: Low lung volumes with weakness  PORTABLE CHEST - 1 VIEW  Comparison:  11/29/2011  Findings:  Continued worsening aeration with decreasing lung volumes and increasing bibasilar opacities.  Edema versus early infiltrates versus subsegmental atelectasis. There is borderline cardiac enlargement.  IMPRESSION: Continued worsening aeration.  Original Report Authenticated By: Elsie Stain, M.D.     Disposition: Home  Diet: Gen.  Activity:    Follow-up Appts:   TESTS THAT NEED FOLLOW-UP   Time spent on discharge, talking to the patient, and coordinating care: 40 mins.   SignedRamiro Harvest 12/10/2011, 7:20 PM

## 2011-12-10 NOTE — Progress Notes (Signed)
Physical Therapy Treatment Patient Details Name: Xavier White MRN: [id number] DOB: 12/11/1945 Today's Date: 12/10/2011 Time: 0820-0850 PT Time Calculation (min): 30 min  PT Assessment / Plan / Recommendation Comments on Treatment Session  pt is much improved today.  He is ready to d/c to home with use of RW or straight cane and assist of wife.  Pt was given phone numbers for [place] outpatient centers should he want to get more therapy core stabalization exercises to help with back pain    Follow Up Recommendations  Outpatient PT    Barriers to Discharge        Equipment Recommendations  None recommended by PT    Recommendations for Other Services    Frequency Min 3X/week   Plan Discharge plan remains appropriate;Frequency remains appropriate    Precautions / Restrictions Precautions Precautions: Fall Restrictions Weight Bearing Restrictions: No   Pertinent Vitals/Pain Pt with intermittent c/o back pain, but still able to walk well with straight cane    Mobility  Bed Mobility Bed Mobility: Rolling Right;Rolling Left;Right Sidelying to Sit;Left Sidelying to Sit;Supine to Sit;Sitting - Scoot to [place] of Bed;Sit to Supine Rolling Right: 7: Independent Rolling Left: 7: Independent Right Sidelying to Sit: 7: Independent Left Sidelying to Sit: 7: Independent Supine to Sit: 7: Independent Sitting - Scoot to Edge of Bed: 7: Independent Sit to Supine: 7: Independent Details for Bed Mobility Assistance: pt uses log rolling technique Transfers Transfers: Sit to Stand;Stand to Sit Sit to Stand: 7: Independent Stand to Sit: 7: Independent Ambulation/Gait Ambulation/Gait Assistance: 6: Modified independent (Device/Increase time) Ambulation Distance (Feet): 400 Feet Assistive device: Straight cane Ambulation/Gait Assistance Details: pt able to walk with cane better today. He carries it in either hand, but is able to fall into correct sequence  Gait Pattern: Step-through  pattern;Trunk flexed Gait velocity: WFL General Gait Details: pt more steady today, but still needs single point cane for balance Stairs: Yes Stairs Assistance: 5: Supervision Stairs Assistance Details (indicate cue type and reason): pt able to do steps with one rail and straight cane Stair Management Technique: One rail Right;With cane Number of Stairs: 5  Wheelchair Mobility Wheelchair Mobility: No    Exercises Other Exercises Other Exercises: pt issued handout for core stabalization and balance.  pt would only practice a few of them but verbalized understanding   PT Diagnosis:    PT Problem List:   PT Treatment Interventions:     PT Goals Acute Rehab PT Goals PT Goal Formulation: With patient/family Time For Goal Achievement: 12/16/11 Potential to Achieve Goals: Good Pt will Ambulate: >150 feet;with modified independence;with least restrictive assistive device PT Goal: Ambulate - Progress: Met Pt will Go Up / Down Stairs: 3-5 stairs;with least restrictive assistive device;with supervision PT Goal: Up/Down Stairs - Progress: Met Pt will Perform Home Exercise Program: with supervision, verbal cues required/provided PT Goal: Perform Home Exercise Program - Progress: Progressing toward goal  Visit Information  Last PT Received On: 12/10/11 Assistance Needed: +1    Subjective Data  Subjective: pt planning to go home today Patient Stated Goal: to go home with wife   Cognition  Overall Cognitive Status: Appears within functional limits for tasks assessed/performed Arousal/Alertness: Awake/alert Orientation Level: Appears intact for tasks assessed Behavior During Session: [facility] for tasks performed (needs direction or can be easily agitated) Cognition - Other Comments: pt joking with everyone, needs redirection to task    Balance  Balance Balance Assessed: No Static Sitting Balance Static Sitting - Balance Support: No  upper extremity supported;Feet supported Static Sitting -  Level of Assistance: 7: Independent  End of Session PT - End of Session Activity Tolerance: Patient limited by pain (pt started to c/o back pain) Patient left: in chair Nurse Communication: Mobility status    Donnetta Hail 12/10/2011, 9:18 AM

## 2013-03-28 ENCOUNTER — Institutional Professional Consult (permissible substitution): Payer: Medicare Other | Admitting: Diagnostic Neuroimaging

## 2013-05-01 ENCOUNTER — Ambulatory Visit: Payer: Medicare Other | Admitting: Neurology

## 2014-01-14 ENCOUNTER — Other Ambulatory Visit: Payer: Self-pay | Admitting: Orthopaedic Surgery

## 2014-01-14 DIAGNOSIS — M4716 Other spondylosis with myelopathy, lumbar region: Secondary | ICD-10-CM

## 2014-01-23 ENCOUNTER — Ambulatory Visit
Admission: RE | Admit: 2014-01-23 | Discharge: 2014-01-23 | Disposition: A | Discharge status: Home / Self Care | Payer: No Typology Code available for payment source | Source: Ambulatory Visit | Attending: Orthopaedic Surgery | Admitting: Orthopaedic Surgery

## 2014-01-23 DIAGNOSIS — M4716 Other spondylosis with myelopathy, lumbar region: Secondary | ICD-10-CM

## 2014-03-07 ENCOUNTER — Encounter (HOSPITAL_COMMUNITY): Payer: Self-pay | Admitting: Emergency Medicine

## 2014-03-07 ENCOUNTER — Emergency Department (HOSPITAL_COMMUNITY): Payer: Medicare Other

## 2014-03-07 ENCOUNTER — Inpatient Hospital Stay (HOSPITAL_COMMUNITY)
Admission: EM | Admit: 2014-03-07 | Discharge: 2014-03-11 | DRG: 871 | Disposition: A | Discharge status: Home / Self Care | Payer: Medicare Other | Attending: Family Medicine | Admitting: Family Medicine

## 2014-03-07 DIAGNOSIS — E1142 Type 2 diabetes mellitus with diabetic polyneuropathy: Secondary | ICD-10-CM | POA: Diagnosis present

## 2014-03-07 DIAGNOSIS — B1920 Unspecified viral hepatitis C without hepatic coma: Secondary | ICD-10-CM | POA: Diagnosis present

## 2014-03-07 DIAGNOSIS — F1120 Opioid dependence, uncomplicated: Secondary | ICD-10-CM | POA: Diagnosis present

## 2014-03-07 DIAGNOSIS — R339 Retention of urine, unspecified: Secondary | ICD-10-CM | POA: Diagnosis present

## 2014-03-07 DIAGNOSIS — K219 Gastro-esophageal reflux disease without esophagitis: Secondary | ICD-10-CM | POA: Diagnosis present

## 2014-03-07 DIAGNOSIS — J449 Chronic obstructive pulmonary disease, unspecified: Secondary | ICD-10-CM | POA: Diagnosis present

## 2014-03-07 DIAGNOSIS — Z8249 Family history of ischemic heart disease and other diseases of the circulatory system: Secondary | ICD-10-CM

## 2014-03-07 DIAGNOSIS — R6520 Severe sepsis without septic shock: Secondary | ICD-10-CM

## 2014-03-07 DIAGNOSIS — E2749 Other adrenocortical insufficiency: Secondary | ICD-10-CM | POA: Diagnosis present

## 2014-03-07 DIAGNOSIS — E869 Volume depletion, unspecified: Secondary | ICD-10-CM | POA: Diagnosis present

## 2014-03-07 DIAGNOSIS — E872 Acidosis, unspecified: Secondary | ICD-10-CM | POA: Diagnosis present

## 2014-03-07 DIAGNOSIS — Z6831 Body mass index (BMI) 31.0-31.9, adult: Secondary | ICD-10-CM | POA: Diagnosis not present

## 2014-03-07 DIAGNOSIS — N1330 Unspecified hydronephrosis: Secondary | ICD-10-CM | POA: Diagnosis present

## 2014-03-07 DIAGNOSIS — R6521 Severe sepsis with septic shock: Secondary | ICD-10-CM

## 2014-03-07 DIAGNOSIS — E785 Hyperlipidemia, unspecified: Secondary | ICD-10-CM | POA: Diagnosis present

## 2014-03-07 DIAGNOSIS — D6959 Other secondary thrombocytopenia: Secondary | ICD-10-CM | POA: Diagnosis present

## 2014-03-07 DIAGNOSIS — Z87891 Personal history of nicotine dependence: Secondary | ICD-10-CM

## 2014-03-07 DIAGNOSIS — J9600 Acute respiratory failure, unspecified whether with hypoxia or hypercapnia: Secondary | ICD-10-CM | POA: Diagnosis present

## 2014-03-07 DIAGNOSIS — N179 Acute kidney failure, unspecified: Secondary | ICD-10-CM | POA: Diagnosis present

## 2014-03-07 DIAGNOSIS — M549 Dorsalgia, unspecified: Secondary | ICD-10-CM

## 2014-03-07 DIAGNOSIS — D649 Anemia, unspecified: Secondary | ICD-10-CM | POA: Diagnosis present

## 2014-03-07 DIAGNOSIS — G8929 Other chronic pain: Secondary | ICD-10-CM | POA: Diagnosis present

## 2014-03-07 DIAGNOSIS — E119 Type 2 diabetes mellitus without complications: Secondary | ICD-10-CM

## 2014-03-07 DIAGNOSIS — R5383 Other fatigue: Secondary | ICD-10-CM | POA: Diagnosis present

## 2014-03-07 DIAGNOSIS — J4489 Other specified chronic obstructive pulmonary disease: Secondary | ICD-10-CM | POA: Diagnosis present

## 2014-03-07 DIAGNOSIS — A419 Sepsis, unspecified organism: Secondary | ICD-10-CM | POA: Diagnosis present

## 2014-03-07 DIAGNOSIS — R5381 Other malaise: Secondary | ICD-10-CM | POA: Diagnosis present

## 2014-03-07 DIAGNOSIS — E1149 Type 2 diabetes mellitus with other diabetic neurological complication: Secondary | ICD-10-CM | POA: Diagnosis present

## 2014-03-07 DIAGNOSIS — K7460 Unspecified cirrhosis of liver: Secondary | ICD-10-CM | POA: Diagnosis present

## 2014-03-07 DIAGNOSIS — I959 Hypotension, unspecified: Secondary | ICD-10-CM | POA: Diagnosis present

## 2014-03-07 DIAGNOSIS — I10 Essential (primary) hypertension: Secondary | ICD-10-CM | POA: Diagnosis present

## 2014-03-07 DIAGNOSIS — R531 Weakness: Secondary | ICD-10-CM | POA: Diagnosis present

## 2014-03-07 DIAGNOSIS — J9601 Acute respiratory failure with hypoxia: Secondary | ICD-10-CM

## 2014-03-07 DIAGNOSIS — I9589 Other hypotension: Secondary | ICD-10-CM

## 2014-03-07 LAB — CBC WITH DIFFERENTIAL/PLATELET
BASOS ABS: 0.1 10*3/uL (ref 0.0–0.1)
Basophils Relative: 1 % (ref 0–1)
EOS ABS: 0.1 10*3/uL (ref 0.0–0.7)
EOS PCT: 1 % (ref 0–5)
HEMATOCRIT: 41.9 % (ref 39.0–52.0)
Hemoglobin: 13.7 g/dL (ref 13.0–17.0)
LYMPHS PCT: 11 % — AB (ref 12–46)
Lymphs Abs: 1.3 10*3/uL (ref 0.7–4.0)
MCH: 30.9 pg (ref 26.0–34.0)
MCHC: 32.7 g/dL (ref 30.0–36.0)
MCV: 94.6 fL (ref 78.0–100.0)
MONO ABS: 0.9 10*3/uL (ref 0.1–1.0)
Monocytes Relative: 8 % (ref 3–12)
Neutro Abs: 9.8 10*3/uL — ABNORMAL HIGH (ref 1.7–7.7)
Neutrophils Relative %: 81 % — ABNORMAL HIGH (ref 43–77)
PLATELETS: 156 10*3/uL (ref 150–400)
RBC: 4.43 MIL/uL (ref 4.22–5.81)
RDW: 13.5 % (ref 11.5–15.5)
WBC: 12.2 10*3/uL — ABNORMAL HIGH (ref 4.0–10.5)

## 2014-03-07 LAB — COMPREHENSIVE METABOLIC PANEL
ALT: 64 U/L — AB (ref 0–53)
AST: 86 U/L — AB (ref 0–37)
Albumin: 3.1 g/dL — ABNORMAL LOW (ref 3.5–5.2)
Alkaline Phosphatase: 126 U/L — ABNORMAL HIGH (ref 39–117)
Anion gap: 17 — ABNORMAL HIGH (ref 5–15)
BUN: 17 mg/dL (ref 6–23)
CALCIUM: 9.7 mg/dL (ref 8.4–10.5)
CO2: 24 meq/L (ref 19–32)
CREATININE: 3.47 mg/dL — AB (ref 0.50–1.35)
Chloride: 94 mEq/L — ABNORMAL LOW (ref 96–112)
GFR calc non Af Amer: 17 mL/min — ABNORMAL LOW (ref >90)
GFR, EST AFRICAN AMERICAN: 20 mL/min — AB (ref >90)
GLUCOSE: 171 mg/dL — AB (ref 70–99)
Potassium: 4.5 mEq/L (ref 3.7–5.3)
Sodium: 135 mEq/L — ABNORMAL LOW (ref 137–147)
TOTAL PROTEIN: 8.2 g/dL (ref 6.0–8.3)
Total Bilirubin: 0.8 mg/dL (ref 0.3–1.2)

## 2014-03-07 LAB — URINALYSIS, ROUTINE W REFLEX MICROSCOPIC
Bilirubin Urine: NEGATIVE
Glucose, UA: NEGATIVE mg/dL
Hgb urine dipstick: NEGATIVE
Ketones, ur: NEGATIVE mg/dL
Leukocytes, UA: NEGATIVE
NITRITE: NEGATIVE
Protein, ur: 30 mg/dL — AB
SPECIFIC GRAVITY, URINE: 1.021 (ref 1.005–1.030)
Urobilinogen, UA: 0.2 mg/dL (ref 0.0–1.0)
pH: 5 (ref 5.0–8.0)

## 2014-03-07 LAB — URINE MICROSCOPIC-ADD ON

## 2014-03-07 LAB — TROPONIN I

## 2014-03-07 LAB — I-STAT CG4 LACTIC ACID, ED: LACTIC ACID, VENOUS: 3.44 mmol/L — AB (ref 0.5–2.2)

## 2014-03-07 MED ORDER — ACETAMINOPHEN 325 MG PO TABS
650.0000 mg | ORAL_TABLET | Freq: Once | ORAL | Status: DC
  Filled 2014-03-07: qty 2

## 2014-03-07 MED ORDER — IBUPROFEN 200 MG PO TABS
200.0000 mg | ORAL_TABLET | Freq: Once | ORAL | Status: AC
  Administered 2014-03-07: 200 mg via ORAL
  Filled 2014-03-07: qty 1

## 2014-03-07 MED ORDER — SODIUM CHLORIDE 0.9 % IV SOLN
INTRAVENOUS | Status: DC
  Administered 2014-03-08 – 2014-03-09 (×5): via INTRAVENOUS

## 2014-03-07 MED ORDER — SODIUM CHLORIDE 0.9 % IV BOLUS (SEPSIS)
1000.0000 mL | Freq: Once | INTRAVENOUS | Status: AC
  Administered 2014-03-07: 1000 mL via INTRAVENOUS

## 2014-03-07 NOTE — ED Notes (Signed)
Patient presents via [facility] for near syncope. Per EMS, family found patient on all fours next to bed. Family and patient deny LOC. Patient reports feeling weak just before same. EMS reports one episode of vomiting in seated position when transferring patient to stretcher. Initial BP, per EMS, was 90/48.

## 2014-03-07 NOTE — H&P (Addendum)
[facility] History and Physical  Xavier White:[id number] DOB: 1946-03-07 DOA: 03/07/2014  Referring physician: ER physician PCP: Philis Fendt, MD   Chief Complaint: near syncope  HPI:  68 year old male with past medical history of hypertension, diabetes, dyslipidemia,, GERD who presented to [facility] ED 03/07/2014 with reports of sudden onset weakness and fall trying to climb the ladder at home. Patient reports he did not fall but he almost fell. Patient did not have prodromal symptoms prior to weakness such as chest pain or shortness of breath. No complaints of palpitations. He feels still weak but better since in ED.  no complaints of abdominal pain, nausea or vomiting. No fevers or chills. No cough. He reports no smoking or drinking. He does have a history of chronic opioid dependence.   in ED, blood pressure was 93/51 and continued to be low even with IV fluids given in ED. Tmax was 101.5 F and oxygen saturation was 88% on room air. Oxygen saturation improved with 2 L nasal cannula oxygen support to 91%. Blood work revealed a mild leukocytosis of 12.2. Lactic acid was elevated at 3.44. Liver enzymes were elevated at AST 86, ALT 64 and ALP 126. Creatinine was elevated at 3.47. Urinalysis did not show evidence of infection. Chest x-ray did not show acute cardiopulmonary findings.   Assessment & Plan    Principal Problem:   Weakness / Near syncope  Unclear ideology. Rule out cardiac causes. First troponin level was within normal limits. The 12-lead EKG showed normal sinus rhythm. Cycle cardiac enzymes.  Obtain CT head.  Obtained TSH level.   PT evaluation once pt able to participate.   Active problems: Severe sepsis  Severe sepsis criteria met with persistent hypotension even with IV fluid resuscitation in ED, fever, leukocytosis and lactic acidosis. There is no clear source of infection. Urinalysis was unremarkable and chest x-ray did not show acute cardiopulmonary  findings.  Started on broad-spectrum antibiotics, Vancomycin and Zosyn. Please monitor renal function will patient is on vancomycin. Narrow down as soon as blood culture results available.  Abnormal LFTs  AST 86, ALT 64 and ALP 126. Unclear etiology. No complaints of right upper quadrant pain.  Obtain abdominal ultrasound for further evaluation.  Check urine drug screen and alcohol level. Acute renal failure  Possibly because of sepsis versus lisinopril and metformin. Those 2 medications are on hold.  Continue IV fluids. 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 Hold metformin due to renal insufficiency.  Order placed for sliding scale insulin  Continue Lyrica for neuropathy  DVT prophylaxis  SCD's bilaterally   Radiological Exams on Admission: Dg Chest Port 1 View  03/07/2014   No acute cardiopulmonary process.   Electronically Signed   By: Elon Alas   On: 03/07/2014 22:49    EKG: sinus rhythm  Code Status: Full Family Communication: Plan of care discussed with the patient  Disposition Plan: Admit for further evaluation, telemetry floor  Leisa Lenz, MD  [facility] Pager [phone]  Review of Systems:  Constitutional: Negative for fever, chills and malaise/fatigue. Negative for diaphoresis.  HENT: Negative for hearing loss, ear pain, nosebleeds, congestion, sore throat, neck pain, tinnitus and ear discharge.   Eyes: Negative for blurred vision, double vision, photophobia, pain, discharge and redness.  Respiratory: Negative for cough, hemoptysis, sputum production, shortness of breath, wheezing and stridor.   Cardiovascular: Negative for chest pain, palpitations, orthopnea, claudication and leg swelling.  Gastrointestinal: Negative for nausea, vomiting  and abdominal pain. Negative for heartburn, constipation, blood in stool and melena.  Genitourinary: Negative for dysuria, urgency, frequency,  hematuria and flank pain.  Musculoskeletal: Negative for myalgias, back pain, joint pain and falls.  Skin: Negative for itching and rash.  Neurological: per HPI Endo/Heme/Allergies: Negative for environmental allergies and polydipsia. Does not bruise/bleed easily.  Psychiatric/Behavioral: Negative for suicidal ideas. The patient is not nervous/anxious.      Past Medical History  Diagnosis Date  . Hepatitis C   . Diabetes mellitus   . Hypertension   . Neuropathy     feet  . Bronchitis, chronic    Past Surgical History  Procedure Laterality Date  . Back surgery      5  . Cholecystectomy    . Spinal cord stimulator insertion     Social History:  reports that he has quit smoking. He has never used smokeless tobacco. He reports that he does not drink alcohol. His drug history is not on file.  Allergies  Allergen Reactions  . Gabapentin Other (See Comments)    Depressed drowsy   . Narcan [Naloxone] Other (See Comments)    Admitted to hospital Neuorologist says not to take medication      Family History: hypertension in family.   Prior to Admission medications   Medication Sig Start Date End Date Taking? Authorizing Provider  hydrOXYzine (ATARAX/VISTARIL) 25 MG tablet Take 25 mg by mouth every 6 (six) hours as needed for itching.  02/14/14  Yes Historical Provider, MD  lisinopril (PRINIVIL,ZESTRIL) 40 MG tablet Take 40 mg by mouth daily.   Yes Historical Provider, MD  metFORMIN (GLUCOPHAGE) 500 MG tablet Take 500 mg by mouth 2 (two) times daily with a meal.   Yes Historical Provider, MD  omeprazole (PRILOSEC) 20 MG capsule Take 20 mg by mouth daily.   Yes Historical Provider, MD  oxyCODONE (ROXICODONE) 15 MG immediate release tablet Take 15 mg by mouth every 4 (four) hours as needed for pain.   Yes Historical Provider, MD  pregabalin (LYRICA) 75 MG capsule Take 150 mg by mouth 2 (two) times daily.   Yes Historical Provider, MD  simvastatin (ZOCOR) 20 MG tablet Take 20 mg by mouth  every evening.   Yes Historical Provider, MD  sucralfate (CARAFATE) 1 G tablet Take 1 g by mouth 3 (three) times daily.  02/09/14  Yes Historical Provider, MD   Physical Exam: Filed Vitals:   03/07/14 2203  BP: 98/54  Pulse: 97  Temp: 102.6 F (39.2 C)  TempSrc: Oral  Resp: 18  SpO2: 88%    Physical Exam  Constitutional: Appears well-developed and well-nourished. No distress.  HENT: Normocephalic. No tonsillar erythema or exudates Eyes: Conjunctivae and EOM are normal. PERRLA, no scleral icterus.  Neck: Normal ROM. Neck supple. No JVD. No tracheal deviation. No thyromegaly.  CVS: RRR, S1/S2 +, no murmurs, no gallops, no carotid bruit.  Pulmonary: Effort and breath sounds normal, no stridor, rhonchi, wheezes, rales.  Abdominal: Soft. BS +,  no distension, tenderness, rebound or guarding.  Musculoskeletal: Normal range of motion. No edema and no tenderness.  Lymphadenopathy: No lymphadenopathy noted, cervical, inguinal. Neuro: Alert. Normal reflexes, muscle tone coordination.  Skin: Skin is warm and dry. No rash noted. Not diaphoretic. No erythema. No pallor.  Psychiatric: Normal mood and affect. Behavior, judgment, thought content normal.   Labs on Admission:  Basic Metabolic Panel:  Recent Labs Lab 03/07/14 2239  NA 135*  K 4.5  CL 94*  CO2 24  GLUCOSE 171*  BUN 17  CREATININE 3.47*  CALCIUM 9.7   Liver Function Tests:  Recent Labs Lab 03/07/14 2239  AST 86*  ALT 64*  ALKPHOS 126*  BILITOT 0.8  PROT 8.2  ALBUMIN 3.1*   No results found for this basename: LIPASE, AMYLASE,  in the last 168 hours No results found for this basename: AMMONIA,  in the last 168 hours CBC:  Recent Labs Lab 03/07/14 2239  WBC 12.2*  NEUTROABS 9.8*  HGB 13.7  HCT 41.9  MCV 94.6  PLT 156   Cardiac Enzymes:  Recent Labs Lab 03/07/14 2239  TROPONINI <0.30   BNP: No components found with this basename: POCBNP,  CBG: No results found for this basename: GLUCAP,  in the  last 168 hours  If 7PM-7AM, please contact night-coverage [email or web address] Password [facility] 03/07/2014, 11:52 PM

## 2014-03-07 NOTE — ED Notes (Signed)
Patient reports pain from buttocks down bilaterally, patient also reports pain to right arm. Patient rates pain 8/10. Patient has severe weakness in bilateral legs, which he reports wasn't present before near syncope episode. Patient denies other symptoms.

## 2014-03-07 NOTE — ED Provider Notes (Signed)
CSN: [id number]     Arrival date & time 03/07/14  2140 History   First MD Initiated Contact with Patient 03/07/14 2151     Chief Complaint  Patient presents with  . Hypotension  . Near Syncope     (Consider location/radiation/quality/duration/timing/severity/associated sxs/prior Treatment) HPI Comments: Patient here complaining of acute onset of whole-body weakness and worsening chronic back pain. Denies any recent back trauma prior to tonight's weakness. No abdominal pain. No changes bowel or bladder function. No history of IV drug abuse. Patient had a near-syncopal events and fell down to all 4 extremities without striking his head. Was witnessed by family. One episode of nonbilious vomiting. Initial blood pressure was 90/48. Patient given IV fluids and blood pressure improved. He notes some cough and congestion without anginal type chest pain. No headache or neck stiffness. No photophobia. Symptoms are persistent. Denies any recent illnesses.  Patient is a 68 y.o. male presenting with near-syncope. The history is provided by the patient.  Near Syncope    Past Medical History  Diagnosis Date  . Hepatitis C   . Diabetes mellitus   . Hypertension   . Neuropathy     feet  . Bronchitis, chronic    Past Surgical History  Procedure Laterality Date  . Back surgery      5  . Cholecystectomy    . Spinal cord stimulator insertion     No family history on file. History  Substance Use Topics  . Smoking status: Former Games developermoker  . Smokeless tobacco: Never Used  . Alcohol Use: No    Review of Systems  Cardiovascular: Positive for near-syncope.  All other systems reviewed and are negative.     Allergies  Review of patient's allergies indicates no known allergies.  Home Medications   Prior to Admission medications   Medication Sig Start Date End Date Taking? Authorizing Provider  esomeprazole (NEXIUM) 40 MG capsule Take 40 mg by mouth daily before breakfast.    Historical  Provider, MD  hydrOXYzine (ATARAX/VISTARIL) 25 MG tablet  02/14/14   Historical Provider, MD  lisinopril (PRINIVIL,ZESTRIL) 40 MG tablet Take 40 mg by mouth daily.    Historical Provider, MD  metFORMIN (GLUCOPHAGE) 500 MG tablet Take 500 mg by mouth 2 (two) times daily with a meal.    Historical Provider, MD  morphine (MS CONTIN) 60 MG 12 hr tablet Take 60 mg by mouth 3 (three) times daily as needed. pain    Historical Provider, MD  oxycodone (ROXICODONE) 30 MG immediate release tablet Take 30 mg by mouth 4 (four) times daily as needed. pain    Historical Provider, MD  pregabalin (LYRICA) 75 MG capsule Take 150 mg by mouth 2 (two) times daily.    Historical Provider, MD  simvastatin (ZOCOR) 20 MG tablet Take 20 mg by mouth every evening.    Historical Provider, MD  sucralfate (CARAFATE) 1 G tablet  02/09/14   Historical Provider, MD   There were no vitals taken for this visit. Physical Exam  Nursing note and vitals reviewed. Constitutional: He is oriented to person, place, and time. He appears well-developed and well-nourished. He appears lethargic.  Non-toxic appearance. No distress.  HENT:  Head: Normocephalic and atraumatic.  Eyes: Conjunctivae, EOM and lids are normal. Pupils are equal, round, and reactive to light.  Neck: Normal range of motion. Neck supple. No tracheal deviation present. No mass present.  Cardiovascular: Normal rate, regular rhythm and normal heart sounds.  Exam reveals no gallop.   No  murmur heard. Pulmonary/Chest: Effort normal and breath sounds normal. No stridor. No respiratory distress. He has no decreased breath sounds. He has no wheezes. He has no rhonchi. He has no rales.  Abdominal: Soft. Normal appearance and bowel sounds are normal. He exhibits no distension. There is no tenderness. There is no rebound and no CVA tenderness.  Musculoskeletal: Normal range of motion. He exhibits no edema and no tenderness.  Neurological: He is oriented to person, place, and time.  He appears lethargic. He displays no tremor. No cranial nerve deficit or sensory deficit. He exhibits normal muscle tone. GCS eye subscore is 4. GCS verbal subscore is 5. GCS motor subscore is 6.  Reflex Scores:      Patellar reflexes are 3+ on the right side and 3+ on the left side. Lower extremity exam is limited by patient's effort. No atrophy noted. Reflexes normal and his patella.  Skin: Skin is warm and dry. No abrasion and no rash noted.  Psychiatric: He has a normal mood and affect. His speech is normal and behavior is normal.    ED Course  Procedures (including critical care time) Labs Review Labs Reviewed  CULTURE, BLOOD (ROUTINE X 2)  CULTURE, BLOOD (ROUTINE X 2)  URINE CULTURE  TROPONIN I  CBC WITH DIFFERENTIAL  COMPREHENSIVE METABOLIC PANEL  URINALYSIS, ROUTINE W REFLEX MICROSCOPIC  I-STAT CG4 LACTIC ACID, ED    Imaging Review No results found.   EKG Interpretation   Date/Time:  Thursday March 07 2014 21:59:35 EDT Ventricular Rate:  96 PR Interval:  132 QRS Duration: 76 QT Interval:  364 QTC Calculation: 460 R Axis:   66 Text Interpretation:  Sinus rhythm Atrial premature complex nsclt  Confirmed by Freida Busman  MD, Yenty Bloch ([id number]) on 03/07/2014 10:08:06 PM      MDM   Final diagnoses:  None    Pt to be admitted for evaluation of his fever, suspect possible early bacteremia, hospitalist will write admission orders    Toy Baker, MD 03/07/14 2316

## 2014-03-07 NOTE — ED Notes (Signed)
Bed: [id number] Expected date: 03/07/14 Expected time: 9:29 PM Means of arrival: Ambulance Comments: Found on floor, no loc, hypotensive

## 2014-03-08 ENCOUNTER — Inpatient Hospital Stay (HOSPITAL_COMMUNITY): Payer: Medicare Other

## 2014-03-08 ENCOUNTER — Encounter (HOSPITAL_COMMUNITY): Payer: Self-pay | Admitting: Pulmonary Disease

## 2014-03-08 DIAGNOSIS — J9601 Acute respiratory failure with hypoxia: Secondary | ICD-10-CM | POA: Diagnosis present

## 2014-03-08 DIAGNOSIS — A419 Sepsis, unspecified organism: Secondary | ICD-10-CM

## 2014-03-08 DIAGNOSIS — E119 Type 2 diabetes mellitus without complications: Secondary | ICD-10-CM

## 2014-03-08 DIAGNOSIS — I9589 Other hypotension: Secondary | ICD-10-CM

## 2014-03-08 DIAGNOSIS — N179 Acute kidney failure, unspecified: Secondary | ICD-10-CM | POA: Diagnosis present

## 2014-03-08 DIAGNOSIS — R6520 Severe sepsis without septic shock: Secondary | ICD-10-CM

## 2014-03-08 LAB — RAPID URINE DRUG SCREEN, HOSP PERFORMED
Amphetamines: NOT DETECTED
Barbiturates: NOT DETECTED
Benzodiazepines: POSITIVE — AB
COCAINE: NOT DETECTED
Opiates: POSITIVE — AB
Tetrahydrocannabinol: NOT DETECTED

## 2014-03-08 LAB — COMPREHENSIVE METABOLIC PANEL
ALT: 58 U/L — AB (ref 0–53)
AST: 157 U/L — AB (ref 0–37)
Albumin: 2.1 g/dL — ABNORMAL LOW (ref 3.5–5.2)
Alkaline Phosphatase: 86 U/L (ref 39–117)
Anion gap: 11 (ref 5–15)
BILIRUBIN TOTAL: 0.9 mg/dL (ref 0.3–1.2)
BUN: 20 mg/dL (ref 6–23)
CHLORIDE: 98 meq/L (ref 96–112)
CO2: 24 mEq/L (ref 19–32)
Calcium: 8 mg/dL — ABNORMAL LOW (ref 8.4–10.5)
Creatinine, Ser: 2.92 mg/dL — ABNORMAL HIGH (ref 0.50–1.35)
GFR calc Af Amer: 24 mL/min — ABNORMAL LOW (ref >90)
GFR calc non Af Amer: 21 mL/min — ABNORMAL LOW (ref >90)
Glucose, Bld: 167 mg/dL — ABNORMAL HIGH (ref 70–99)
POTASSIUM: 4.2 meq/L (ref 3.7–5.3)
Sodium: 133 mEq/L — ABNORMAL LOW (ref 137–147)
Total Protein: 5.9 g/dL — ABNORMAL LOW (ref 6.0–8.3)

## 2014-03-08 LAB — CBC WITH DIFFERENTIAL/PLATELET
Basophils Absolute: 0 10*3/uL (ref 0.0–0.1)
Basophils Relative: 0 % (ref 0–1)
Eosinophils Absolute: 0.1 10*3/uL (ref 0.0–0.7)
Eosinophils Relative: 1 % (ref 0–5)
HEMATOCRIT: 34.2 % — AB (ref 39.0–52.0)
HEMOGLOBIN: 11.1 g/dL — AB (ref 13.0–17.0)
Lymphocytes Relative: 22 % (ref 12–46)
Lymphs Abs: 2.4 10*3/uL (ref 0.7–4.0)
MCH: 30.9 pg (ref 26.0–34.0)
MCHC: 32.5 g/dL (ref 30.0–36.0)
MCV: 95.3 fL (ref 78.0–100.0)
MONO ABS: 0.9 10*3/uL (ref 0.1–1.0)
MONOS PCT: 8 % (ref 3–12)
NEUTROS ABS: 7.2 10*3/uL (ref 1.7–7.7)
Neutrophils Relative %: 68 % (ref 43–77)
Platelets: 115 10*3/uL — ABNORMAL LOW (ref 150–400)
RBC: 3.59 MIL/uL — ABNORMAL LOW (ref 4.22–5.81)
RDW: 13.5 % (ref 11.5–15.5)
WBC: 10.5 10*3/uL (ref 4.0–10.5)

## 2014-03-08 LAB — TROPONIN I: Troponin I: <0.3 ng/mL (ref <0.30)

## 2014-03-08 LAB — LACTIC ACID, PLASMA: LACTIC ACID, VENOUS: 2.1 mmol/L (ref 0.5–2.2)

## 2014-03-08 LAB — MAGNESIUM: Magnesium: 1.9 mg/dL (ref 1.5–2.5)

## 2014-03-08 LAB — GLUCOSE, CAPILLARY
GLUCOSE-CAPILLARY: 172 mg/dL — AB (ref 70–99)
Glucose-Capillary: 219 mg/dL — ABNORMAL HIGH (ref 70–99)
Glucose-Capillary: 162 mg/dL — ABNORMAL HIGH (ref 70–99)
Glucose-Capillary: 141 mg/dL — ABNORMAL HIGH (ref 70–99)
Glucose-Capillary: 194 mg/dL — ABNORMAL HIGH (ref 70–99)

## 2014-03-08 LAB — ETHANOL: Alcohol, Ethyl (B): <11 mg/dL (ref 0–11)

## 2014-03-08 LAB — HEMOGLOBIN A1C
HEMOGLOBIN A1C: 7.2 % — AB (ref <5.7)
HEMOGLOBIN A1C: 7.3 % — AB (ref <5.7)
MEAN PLASMA GLUCOSE: 163 mg/dL — AB (ref <117)
Mean Plasma Glucose: 160 mg/dL — ABNORMAL HIGH (ref <117)

## 2014-03-08 LAB — TSH: TSH: 1.34 u[IU]/mL (ref 0.350–4.500)

## 2014-03-08 LAB — APTT: aPTT: 49 seconds — ABNORMAL HIGH (ref 24–37)

## 2014-03-08 LAB — PROTIME-INR
INR: 1.43 (ref 0.00–1.49)
Prothrombin Time: 17.5 seconds — ABNORMAL HIGH (ref 11.6–15.2)

## 2014-03-08 LAB — PROCALCITONIN: Procalcitonin: 5.96 ng/mL

## 2014-03-08 LAB — PHOSPHORUS: PHOSPHORUS: 5.1 mg/dL — AB (ref 2.3–4.6)

## 2014-03-08 LAB — MRSA PCR SCREENING: MRSA by PCR: NEGATIVE

## 2014-03-08 MED ORDER — ACETAMINOPHEN 650 MG RE SUPP: 650.0000 mg | Freq: Four times a day (QID) | RECTAL | Status: DC | PRN

## 2014-03-08 MED ORDER — CETYLPYRIDINIUM CHLORIDE 0.05 % MT LIQD
7.0000 mL | Freq: Two times a day (BID) | OROMUCOSAL | Status: DC
  Administered 2014-03-08: 7 mL via OROMUCOSAL

## 2014-03-08 MED ORDER — SODIUM CHLORIDE 0.9 % IV BOLUS (SEPSIS): 1000.0000 mL | Freq: Once | INTRAVENOUS | Status: DC

## 2014-03-08 MED ORDER — INSULIN ASPART 100 UNIT/ML ~~LOC~~ SOLN
0.0000 [IU] | Freq: Every day | SUBCUTANEOUS | Status: DC
  Administered 2014-03-09: 2 [IU] via SUBCUTANEOUS
  Administered 2014-03-10: 3 [IU] via SUBCUTANEOUS

## 2014-03-08 MED ORDER — SIMVASTATIN 10 MG PO TABS
20.0000 mg | ORAL_TABLET | Freq: Every evening | ORAL | Status: DC
  Filled 2014-03-08: qty 1

## 2014-03-08 MED ORDER — SODIUM CHLORIDE 0.9 % IV BOLUS (SEPSIS)
2000.0000 mL | Freq: Once | INTRAVENOUS | Status: AC
  Administered 2014-03-08: 2000 mL via INTRAVENOUS

## 2014-03-08 MED ORDER — PREGABALIN 75 MG PO CAPS
150.0000 mg | ORAL_CAPSULE | Freq: Two times a day (BID) | ORAL | Status: DC
  Administered 2014-03-08 – 2014-03-11 (×8): 150 mg via ORAL
  Filled 2014-03-08 (×8): qty 2

## 2014-03-08 MED ORDER — ONDANSETRON HCL 4 MG/2ML IJ SOLN: 4.0000 mg | Freq: Four times a day (QID) | INTRAMUSCULAR | Status: DC | PRN

## 2014-03-08 MED ORDER — PIPERACILLIN-TAZOBACTAM 3.375 G IVPB
3.3750 g | Freq: Three times a day (TID) | INTRAVENOUS | Status: DC
  Administered 2014-03-08 – 2014-03-10 (×7): 3.375 g via INTRAVENOUS
  Filled 2014-03-08 (×8): qty 50

## 2014-03-08 MED ORDER — INSULIN ASPART 100 UNIT/ML ~~LOC~~ SOLN
0.0000 [IU] | Freq: Three times a day (TID) | SUBCUTANEOUS | Status: DC
  Administered 2014-03-08 (×2): 2 [IU] via SUBCUTANEOUS
  Administered 2014-03-08: 1 [IU] via SUBCUTANEOUS
  Administered 2014-03-09: 2 [IU] via SUBCUTANEOUS
  Administered 2014-03-09 (×2): 3 [IU] via SUBCUTANEOUS
  Administered 2014-03-10 (×2): 1 [IU] via SUBCUTANEOUS
  Administered 2014-03-10: 3 [IU] via SUBCUTANEOUS
  Administered 2014-03-11: 1 [IU] via SUBCUTANEOUS

## 2014-03-08 MED ORDER — OXYCODONE HCL 5 MG PO TABS: 15.0000 mg | ORAL_TABLET | ORAL | Status: DC | PRN

## 2014-03-08 MED ORDER — SODIUM CHLORIDE 0.9 % IJ SOLN
3.0000 mL | Freq: Two times a day (BID) | INTRAMUSCULAR | Status: DC
  Administered 2014-03-08 – 2014-03-10 (×5): 3 mL via INTRAVENOUS

## 2014-03-08 MED ORDER — ONDANSETRON HCL 4 MG PO TABS: 4.0000 mg | ORAL_TABLET | Freq: Four times a day (QID) | ORAL | Status: DC | PRN

## 2014-03-08 MED ORDER — VANCOMYCIN HCL IN DEXTROSE 1-5 GM/200ML-% IV SOLN
1000.0000 mg | Freq: Once | INTRAVENOUS | Status: AC
  Administered 2014-03-08: 1000 mg via INTRAVENOUS
  Filled 2014-03-08: qty 200

## 2014-03-08 MED ORDER — HYDROXYZINE HCL 25 MG PO TABS
25.0000 mg | ORAL_TABLET | Freq: Four times a day (QID) | ORAL | Status: DC | PRN
  Filled 2014-03-08: qty 1

## 2014-03-08 MED ORDER — SUCRALFATE 1 G PO TABS
1.0000 g | ORAL_TABLET | Freq: Three times a day (TID) | ORAL | Status: DC
  Administered 2014-03-08 – 2014-03-10 (×8): 1 g via ORAL
  Filled 2014-03-08 (×14): qty 1

## 2014-03-08 MED ORDER — HYDROCODONE-ACETAMINOPHEN 5-325 MG PO TABS: 1.0000 | ORAL_TABLET | ORAL | Status: DC | PRN

## 2014-03-08 MED ORDER — PANTOPRAZOLE SODIUM 40 MG PO TBEC
40.0000 mg | DELAYED_RELEASE_TABLET | Freq: Every day | ORAL | Status: DC
  Administered 2014-03-08 – 2014-03-11 (×4): 40 mg via ORAL
  Filled 2014-03-08 (×4): qty 1

## 2014-03-08 MED ORDER — VANCOMYCIN HCL IN DEXTROSE 1-5 GM/200ML-% IV SOLN
1000.0000 mg | Freq: Every day | INTRAVENOUS | Status: DC
  Administered 2014-03-08: 1000 mg via INTRAVENOUS
  Filled 2014-03-08: qty 200

## 2014-03-08 MED ORDER — PHENYLEPHRINE HCL 10 MG/ML IJ SOLN
30.0000 ug/min | INTRAVENOUS | Status: DC
  Administered 2014-03-08: 30 ug/min via INTRAVENOUS
  Administered 2014-03-08: 60 ug/min via INTRAVENOUS
  Administered 2014-03-08 (×2): 45 ug/min via INTRAVENOUS
  Administered 2014-03-08: 50 ug/min via INTRAVENOUS
  Administered 2014-03-08: 40 ug/min via INTRAVENOUS
  Administered 2014-03-09: 35 ug/min via INTRAVENOUS
  Filled 2014-03-08 (×6): qty 1

## 2014-03-08 MED ORDER — SODIUM CHLORIDE 0.9 % IV BOLUS (SEPSIS)
1000.0000 mL | Freq: Once | INTRAVENOUS | Status: AC
  Administered 2014-03-08: 1000 mL via INTRAVENOUS

## 2014-03-08 MED ORDER — ACETAMINOPHEN 325 MG PO TABS
650.0000 mg | ORAL_TABLET | Freq: Four times a day (QID) | ORAL | Status: DC | PRN
  Administered 2014-03-11: 650 mg via ORAL
  Filled 2014-03-08: qty 2

## 2014-03-08 MED ORDER — PIPERACILLIN-TAZOBACTAM 3.375 G IVPB 30 MIN
3.3750 g | Freq: Once | INTRAVENOUS | Status: AC
  Administered 2014-03-08: 3.375 g via INTRAVENOUS
  Filled 2014-03-08: qty 50

## 2014-03-08 MED ORDER — OXYCODONE HCL 5 MG PO TABS
5.0000 mg | ORAL_TABLET | ORAL | Status: DC | PRN
  Administered 2014-03-08 – 2014-03-11 (×13): 5 mg via ORAL
  Filled 2014-03-08 (×14): qty 1

## 2014-03-08 MED ORDER — SODIUM CHLORIDE 0.9 % IV SOLN: INTRAVENOUS | Status: DC

## 2014-03-08 NOTE — Evaluation (Addendum)
Got called about patient with systolic pressures in the 70s. Noted to be admitted for presyncope, severe sepsis with lactic acidosis and mild transaminitis as well as acute kidney injury. Currently asymptomatic per nurse. Has received about 1-1/2 L of normal saline since being admitted. We'll give an additional 1 L bolus. We'll also discuss case with critical care. -add on repeat lactate and PCT -am cortisol -Formally consult PCCM if no improvement in BP with NS bolus

## 2014-03-08 NOTE — ED Notes (Signed)
Patient mother in law Hassan RowanBetty Rahenkamp: [phone]

## 2014-03-08 NOTE — Progress Notes (Signed)
Pt unable to void - c/o full bladder. I&O cath produced 1275 mL clear yellow urine.

## 2014-03-08 NOTE — Care Management Note (Addendum)
    Page 1 of 1   03/11/2014     10:12:05 AM CARE MANAGEMENT NOTE 03/11/2014  Patient:  Xavier White,Xavier White   Account Number:  [id number]  Date Initiated:  03/08/2014  Documentation initiated by:  Lanier ClamMAHABIR,KATHY  Subjective/Objective Assessment:   68 Y/O M ADMITTED W/WEAKNESS,SEVERE SEPSIS.WG:NFAOZHX:OPIOD ABUSE.     Action/Plan:   FROM HOME.   Anticipated DC Date:  03/11/2014   Anticipated DC Plan:  HOME/SELF CARE      DC Planning Services  CM consult      Choice offered to / List presented to:             Status of service:  Completed, signed off Medicare Important Message given?  YES (If response is "NO", the following Medicare IM given date fields will be blank) Date Medicare IM given:  03/11/2014 Medicare IM given by:  [facility]Jag Lenz Date Additional Medicare IM given:   Additional Medicare IM given by:    Discharge Disposition:  HOME/SELF CARE  Per UR Regulation:  Reviewed for med. necessity/level of care/duration of stay  If discussed at Long Length of Stay Meetings, dates discussed:    Comments:  03/08/14 KATHY MAHABIR RN,BSN NCM [phone] CONTINUE TO MONITOR PROGRESS.

## 2014-03-08 NOTE — Progress Notes (Signed)
Pt attempted to void x3. Small amounts of urine produced but patient felt no relief. Abd US showed full bladder. Order obtained for I&O cath. 1100 mL clear yellow urine obtained.

## 2014-03-08 NOTE — Progress Notes (Signed)
ANTIBIOTIC CONSULT NOTE - INITIAL  Pharmacy Consult for Vancomycin and Zosyn  Indication: rule out sepsis  Allergies  Allergen Reactions  . Gabapentin Other (See Comments)    Depressed drowsy   . Narcan [Naloxone] Other (See Comments)    Admitted to hospital Neuorologist says not to take medication      Patient Measurements: Height: 5\' 8"  (172.7 cm) Weight: 198 lb 3.1 oz (89.9 kg) IBW/kg (Calculated) : 68.4 Adjusted Body Weight:   Vital Signs: Temp: 100.3 F (37.9 C) (08/21 0110) Temp src: Oral (08/21 0110) BP: 99/55 mmHg (08/21 0110) Pulse Rate: 97 (08/21 0110) Intake/Output from previous day:   Intake/Output from this shift:    Labs:  Recent Labs  03/07/14 2239  WBC 12.2*  HGB 13.7  PLT 156  CREATININE 3.47*   Estimated Creatinine Clearance: 22.5 ml/min (by C-G formula based on Cr of 3.47). No results found for this basename: VANCOTROUGH, VANCOPEAK, VANCORANDOM, GENTTROUGH, GENTPEAK, GENTRANDOM, TOBRATROUGH, TOBRAPEAK, TOBRARND, AMIKACINPEAK, AMIKACINTROU, AMIKACIN,  in the last 72 hours   Microbiology: No results found for this or any previous visit (from the past 720 hour(s)).  Medical History: Past Medical History  Diagnosis Date  . Hepatitis C   . Diabetes mellitus   . Hypertension   . Neuropathy     feet  . Bronchitis, chronic     Medications:  Anti-infectives   Start     Dose/Rate Route Frequency Ordered Stop   03/08/14 2200  vancomycin (VANCOCIN) IVPB 1000 mg/200 mL premix     1,000 mg 200 mL/hr over 60 Minutes Intravenous Daily at bedtime 03/08/14 0313     03/08/14 0800  piperacillin-tazobactam (ZOSYN) IVPB 3.375 g     3.375 g 12.5 mL/hr over 240 Minutes Intravenous Every 8 hours 03/08/14 0313     03/08/14 0045  vancomycin (VANCOCIN) IVPB 1000 mg/200 mL premix     1,000 mg 200 mL/hr over 60 Minutes Intravenous  Once 03/08/14 0034     03/08/14 0045  piperacillin-tazobactam (ZOSYN) IVPB 3.375 g     3.375 g 100 mL/hr over 30 Minutes  Intravenous  Once 03/08/14 0034 03/08/14 0256     Assessment: Patient with sepsis.  First dose of antibiotics already given. Patient with poor renal function.  Goal of Therapy:  Vancomycin trough level 15-20 mcg/ml Zosyn based on renal function   Plan:  Measure antibiotic drug levels at steady state Follow up culture results Vancomycin 1gm iv q24hr Zosyn 3.375g IV Q8H infused over 4hrs.   Darlina GuysGrimsley Jr, Jacquenette ShoneJulian Crowford 03/08/2014,3:15 AM

## 2014-03-08 NOTE — Progress Notes (Signed)
Pt's BP after bolus 66/33. On call MD notified. Gave orders for stat labs and stat transfer to stepdown unit. Will transfer. Xavier White, [name] I

## 2014-03-08 NOTE — ED Notes (Signed)
Patient's wife: [name] [phone]

## 2014-03-08 NOTE — Progress Notes (Signed)
eLink Physician-Brief Progress Note Patient Name: Alanson AlyDennis E Haris DOB: 1946-06-08 MRN: [id number]   Date of Service  03/08/2014  HPI/Events of Note  [facility]  Persistent hypotension Labs still pending except cbc whick looks ok  eICU Interventions  2 more L fluid bolus Place extra PIV Start neo upon move to ICU Check cortisol stat Move to ICU PCCM primary     Intervention Category Major Interventions: Hypotension - evaluation and management  Nakeda Lebron 03/08/2014, 6:56 AM

## 2014-03-08 NOTE — ED Notes (Signed)
Ethanol drawn by this writer at 0058 was duplicate order. Per lab Marcelle Smilingatasha will d/c duplicate ethanol order

## 2014-03-08 NOTE — Progress Notes (Signed)
Pt's BP this am 71/49. Pt arousable, A&O x4. Pt denies dizziness.On Call MD notified, gave orders to give bolus. Will carry out orders and continue to monitor.
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
## 2014-03-09 ENCOUNTER — Inpatient Hospital Stay (HOSPITAL_COMMUNITY): Payer: Medicare Other

## 2014-03-09 DIAGNOSIS — A419 Sepsis, unspecified organism: Secondary | ICD-10-CM

## 2014-03-09 DIAGNOSIS — R6521 Severe sepsis with septic shock: Secondary | ICD-10-CM

## 2014-03-09 LAB — CBC
HCT: 38.4 % — ABNORMAL LOW (ref 39.0–52.0)
HEMOGLOBIN: 12.5 g/dL — AB (ref 13.0–17.0)
MCH: 31.2 pg (ref 26.0–34.0)
MCHC: 32.6 g/dL (ref 30.0–36.0)
MCV: 95.8 fL (ref 78.0–100.0)
Platelets: 114 10*3/uL — ABNORMAL LOW (ref 150–400)
RBC: 4.01 MIL/uL — AB (ref 4.22–5.81)
RDW: 13.6 % (ref 11.5–15.5)
WBC: 4.6 10*3/uL (ref 4.0–10.5)

## 2014-03-09 LAB — COMPREHENSIVE METABOLIC PANEL
ALBUMIN: 2.2 g/dL — AB (ref 3.5–5.2)
ALT: 81 U/L — AB (ref 0–53)
AST: 212 U/L — AB (ref 0–37)
Alkaline Phosphatase: 144 U/L — ABNORMAL HIGH (ref 39–117)
Anion gap: 10 (ref 5–15)
BUN: 11 mg/dL (ref 6–23)
CALCIUM: 8.2 mg/dL — AB (ref 8.4–10.5)
CO2: 25 meq/L (ref 19–32)
CREATININE: 1.2 mg/dL (ref 0.50–1.35)
Chloride: 103 mEq/L (ref 96–112)
GFR calc Af Amer: 71 mL/min — ABNORMAL LOW (ref >90)
GFR, EST NON AFRICAN AMERICAN: 61 mL/min — AB (ref >90)
Glucose, Bld: 209 mg/dL — ABNORMAL HIGH (ref 70–99)
Potassium: 4.4 mEq/L (ref 3.7–5.3)
SODIUM: 138 meq/L (ref 137–147)
TOTAL PROTEIN: 6.7 g/dL (ref 6.0–8.3)
Total Bilirubin: 0.6 mg/dL (ref 0.3–1.2)

## 2014-03-09 LAB — URINE CULTURE
COLONY COUNT: NO GROWTH
Culture: NO GROWTH

## 2014-03-09 LAB — GLUCOSE, CAPILLARY
Glucose-Capillary: 231 mg/dL — ABNORMAL HIGH (ref 70–99)
Glucose-Capillary: 165 mg/dL — ABNORMAL HIGH (ref 70–99)
Glucose-Capillary: 222 mg/dL — ABNORMAL HIGH (ref 70–99)
Glucose-Capillary: 218 mg/dL — ABNORMAL HIGH (ref 70–99)

## 2014-03-09 LAB — CORTISOL-AM, BLOOD: Cortisol - AM: 4.9 ug/dL (ref 4.3–22.4)

## 2014-03-09 MED ORDER — ALBUTEROL SULFATE (2.5 MG/3ML) 0.083% IN NEBU: 3.0000 mL | INHALATION_SOLUTION | RESPIRATORY_TRACT | Status: DC | PRN

## 2014-03-09 MED ORDER — VANCOMYCIN HCL 10 G IV SOLR
1250.0000 mg | Freq: Two times a day (BID) | INTRAVENOUS | Status: DC
  Administered 2014-03-09 – 2014-03-10 (×2): 1250 mg via INTRAVENOUS
  Filled 2014-03-09 (×3): qty 1250
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
## 2014-03-09 NOTE — Progress Notes (Signed)
ANTIBIOTIC CONSULT NOTE - FOLLOW UP  Pharmacy Consult for Vancomycin, Zosyn Indication: Sepsis  Allergies  Allergen Reactions  . Gabapentin Other (See Comments)    Depressed drowsy   . Narcan [Naloxone] Other (See Comments)    Admitted to hospital Neuorologist says not to take medication      Patient Measurements: Height: 5\' 8"  (172.7 cm) Weight: 206 lb 5.6 oz (93.6 kg) IBW/kg (Calculated) : 68.4 Adjusted Body Weight:   Vital Signs: Temp: 98.3 F (36.8 C) (08/22 1147) Temp src: Axillary (08/22 1147) BP: 87/62 mmHg (08/22 1200) Pulse Rate: 67 (08/22 1200) Intake/Output from previous day: 08/21 0701 - 08/22 0700 In: 8061 [P.O.:840; I.V.:3871; IV Piggyback:3350] Out: 8125 [Urine:8125] Intake/Output from this shift: Total I/O In: 1236.8 [P.O.:480; I.V.:706.8; IV Piggyback:50] Out: 925 [Urine:925]  Labs:  Recent Labs  03/07/14 2239 03/08/14 0634 03/09/14 0616  WBC 12.2* 10.5 4.6  HGB 13.7 11.1* 12.5*  PLT 156 115* 114*  CREATININE 3.47* 2.92* 1.20   Estimated Creatinine Clearance: 66.3 ml/min (by C-G formula based on Cr of 1.2). No results found for this basename: Rolm GalaVANCOTROUGH, VANCOPEAK, VANCORANDOM, GENTTROUGH, GENTPEAK, GENTRANDOM, TOBRATROUGH, TOBRAPEAK, TOBRARND, AMIKACINPEAK, AMIKACINTROU, AMIKACIN,  in the last 72 hours   Microbiology: Recent Results (from the past 720 hour(s))  URINE CULTURE     Status: None   Collection Time    03/07/14 10:20 PM      Result Value Ref Range Status   Specimen Description URINE, CLEAN CATCH   Final   Special Requests NONE   Final   Culture  Setup Time     Final   Value: 03/08/2014 02:39     [facility]formed at [facility]   Colony Count     Final   Value: NO GROWTH     [facility]formed at [facility]   Culture     Final   Value: NO GROWTH     Performed at [facility]   Report Status 03/09/2014 FINAL   Final  MRSA PCR SCREENING     Status: None   Collection Time    03/08/14  8:14 AM      Result Value  Ref Range Status   MRSA by PCR NEGATIVE  NEGATIVE Final   Comment:            The GeneXpert MRSA Assay (FDA     approved for NASAL specimens     only), is one component of a     comprehensive MRSA colonization     surveillance program. It is not     intended to diagnose MRSA     infection nor to guide or     monitor treatment for     MRSA infections.    Anti-infectives   Start     Dose/Rate Route Frequency Ordered Stop   03/08/14 2200  vancomycin (VANCOCIN) IVPB 1000 mg/200 mL premix     1,000 mg 200 mL/hr over 60 Minutes Intravenous Daily at bedtime 03/08/14 0313     03/08/14 0800  piperacillin-tazobactam (ZOSYN) IVPB 3.375 g     3.375 g 12.5 mL/hr over 240 Minutes Intravenous Every 8 hours 03/08/14 0313     03/08/14 0045  vancomycin (VANCOCIN) IVPB 1000 mg/200 mL premix     1,000 mg 200 mL/hr over 60 Minutes Intravenous  Once 03/08/14 0034 03/08/14 0326   03/08/14 0045  piperacillin-tazobactam (ZOSYN) IVPB 3.375 g     3.375 g 100 mL/hr over 30 Minutes Intravenous  Once 03/08/14 0034 03/08/14 0256  Assessment: 68yo M w/ weakness and near syncope. In ED, febrile, hypotensive, elev SCr, LFTs, and lactic acid. Broad-spectrum abx for sepsis.  8/21 >> Vanc >> 8/21 >> Zosyn >>  Tmax: AF now WBCs: down to wnl Renal: Much improved, SCr now 1.20, CrCl 66CG, 60N Lactic acid: improved to wnl PCT: 5.96  8/20 blood x2: sent 8/20 urine: NGF 8/20 MRSA PCR neg  Dose changes/drug level info:  8/22: Vanc 1g q24h -->1250mg  q12h  Goal of Therapy:  Vancomycin trough level 15-20 mcg/ml Eradication of infection  Plan:  Due to improved renal function, adjust vancomycin to 1250mg  IV q12h.  Continue current dose of Zosyn.  F/u cultures, renal function, clinical course  Haynes Hoehn, PharmD, BCPS 03/09/2014, 1:21 PM  Pager: [phone]

## 2014-03-10 LAB — BASIC METABOLIC PANEL
Anion gap: 8 (ref 5–15)
BUN: 8 mg/dL (ref 6–23)
CO2: 30 mEq/L (ref 19–32)
Calcium: 8.5 mg/dL (ref 8.4–10.5)
Chloride: 102 mEq/L (ref 96–112)
Creatinine, Ser: 0.98 mg/dL (ref 0.50–1.35)
GFR calc Af Amer: >90 mL/min (ref >90)
GFR calc non Af Amer: 83 mL/min — ABNORMAL LOW (ref >90)
GLUCOSE: 133 mg/dL — AB (ref 70–99)
Potassium: 3.9 mEq/L (ref 3.7–5.3)
SODIUM: 140 meq/L (ref 137–147)

## 2014-03-10 LAB — CBC
HEMATOCRIT: 38.8 % — AB (ref 39.0–52.0)
Hemoglobin: 12.7 g/dL — ABNORMAL LOW (ref 13.0–17.0)
MCH: 31 pg (ref 26.0–34.0)
MCHC: 32.7 g/dL (ref 30.0–36.0)
MCV: 94.6 fL (ref 78.0–100.0)
Platelets: 102 10*3/uL — ABNORMAL LOW (ref 150–400)
RBC: 4.1 MIL/uL — AB (ref 4.22–5.81)
RDW: 13.1 % (ref 11.5–15.5)
WBC: 3.8 10*3/uL — AB (ref 4.0–10.5)

## 2014-03-10 LAB — GLUCOSE, CAPILLARY
Glucose-Capillary: 146 mg/dL — ABNORMAL HIGH (ref 70–99)
Glucose-Capillary: 204 mg/dL — ABNORMAL HIGH (ref 70–99)
Glucose-Capillary: 127 mg/dL — ABNORMAL HIGH (ref 70–99)

## 2014-03-10 LAB — PROCALCITONIN: Procalcitonin: 2.76 ng/mL

## 2014-03-10 MED ORDER — LEVOFLOXACIN 500 MG PO TABS
500.0000 mg | ORAL_TABLET | Freq: Every day | ORAL | Status: DC
  Administered 2014-03-10 – 2014-03-11 (×2): 500 mg via ORAL
  Filled 2014-03-10 (×2): qty 1

## 2014-03-10 NOTE — Progress Notes (Signed)
[facility] PROGRESS NOTE  Xavier White ZOX:[id number] DOB: December 25, 1945 DOA: 03/07/2014 PCP: Dorrene German, MD  PCCM transfer admitted with sepsis and refractory hypotension, required pressors, on broad spectrum Abx, source was not clear, imporving and Tx to [facility] 8/23  Assessment/Plan: 1. Sepsis with shock -required pressors, now BP stable -stop IVF -source initially unclear but repeat CXR yesterday with concern for RLL infiltrate -all cultures negative -Stop Vanc/Zosyn, day 3 and start PO levaquin  2. AKI -due to sepsis -resolve diwth IVF, treatment of above  3. DM with neuropathy -SSI for now, resume metformin at DC  4. H/o Hep C with concern for cirrhosis noted on [place] -FU with GI  5. Urinary retention with some hydronephrosis -required I/O cath initially, no resolved, voiding without difficulty  6. Thrombocytopenia -chronic, noted in 2013 too, likely due to cirrhosis  DVT proph: SCDs  Code Status: Full Code Family Communication: none at bedside Disposition Plan: home tomorrow if stable   Antibiotics:  Vanc/Zosyn   HPI/Subjective: Feels much better, no complaints, some cough, breathing at baseline  Objective: Filed Vitals:   03/10/14 0549  BP: 122/76  Pulse: 65  Temp: 98.3 F (36.8 C)  Resp: 18    Intake/Output Summary (Last 24 hours) at 03/10/14 0957 Last data filed at 03/10/14 0846  Gross per 24 hour  Intake 2547.5 ml  Output   2125 ml  Net  422.5 ml   Filed Weights   03/08/14 0700 03/09/14 0500 03/10/14 0638  Weight: 93.9 kg (207 lb 0.2 oz) 93.6 kg (206 lb 5.6 oz) 89.812 kg (198 lb)    Exam:   General:  AAOx3  Cardiovascular: S1S2/RRR  Respiratory: ronchi at both bases, R>L  Abdomen: soft, obese, Nt, BS present  Musculoskeletal: no edema, discomfort of lower ext on palpation from neuropathy  Data Reviewed: Basic Metabolic Panel:  Recent Labs Lab 03/07/14 2239 03/08/14 0634 03/09/14 0616 03/10/14 0555  NA 135*  133* 138 140  K 4.5 4.2 4.4 3.9  CL 94* 98 103 102  CO2 GLUCOSE 171* 167* 209* 133*  BUN CREATININE 3.47* 2.92* 1.20 0.98  CALCIUM 9.7 8.0* 8.2* 8.5  MG  --  1.9  --   --   PHOS  --  5.1*  --   --    Liver Function Tests:  Recent Labs Lab 03/07/14 2239 03/08/14 0634 03/09/14 0616  AST 86* 157* 212*  ALT 64* 58* 81*  ALKPHOS 126* 86 144*  BILITOT 0.8 0.9 0.6  PROT 8.2 5.9* 6.7  ALBUMIN 3.1* 2.1* 2.2*   No results found for this basename: LIPASE, AMYLASE,  in the last 168 hours No results found for this basename: AMMONIA,  in the last 168 hours CBC:  Recent Labs Lab 03/07/14 2239 03/08/14 0634 03/09/14 0616 03/10/14 0555  WBC 12.2* 10.5 4.6 3.8*  NEUTROABS 9.8* 7.2  --   --   HGB 13.7 11.1* 12.5* 12.7*  HCT 41.9 34.2* 38.4* 38.8*  MCV 94.6 95.3 95.8 94.6  PLT 156 115* 114* 102*   Cardiac Enzymes:  Recent Labs Lab 03/07/14 2239 03/08/14 0025 03/08/14 0634 03/08/14 1206  TROPONINI <0.30 <0.30 <0.30 <0.30   BNP (last 3 results) No results found for this basename: PROBNP,  in the last 8760 hours CBG:  Recent Labs Lab 03/09/14 0758 03/09/14 1138 03/09/14 1606 03/09/14 2103 03/10/14 0707  GLUCAP 231* 165* 222* 218* 146*    Recent Results (from the past  240 hour(s))  URINE CULTURE     Status: None   Collection Time    03/07/14 10:20 PM      Result Value Ref Range Status   Specimen Description URINE, CLEAN CATCH   Final   Special Requests NONE   Final   Culture  Setup Time     Final   Value: 03/08/2014 02:39     Performed at [facility] Count     Final   Value: NO GROWTH     Performed at [facility]   Culture     Final   Value: NO GROWTH     Performed at [facility]   Report Status 03/09/2014 FINAL   Final  CULTURE, BLOOD (ROUTINE X 2)     Status: None   Collection Time    03/07/14 10:39 PM      Result Value Ref Range Status   Specimen Description BLOOD RIGHT ANTECUBITAL   Final    Special Requests BOTTLES DRAWN AEROBIC AND ANAEROBIC [facility] EACH   Final   Culture  Setup Time     Final   Value: 03/08/2014 02:40     Performed at [facility]   Culture     Final   Value:        BLOOD CULTURE RECEIVED NO GROWTH TO DATE CULTURE WILL BE HELD FOR 5 DAYS BEFORE ISSUING A FINAL NEGATIVE REPORT     Performed at [facility]   Report Status PENDING   Incomplete  CULTURE, BLOOD (ROUTINE X 2)     Status: None   Collection Time    03/07/14 10:39 PM      Result Value Ref Range Status   Specimen Description BLOOD LEFT HAND   Final   Special Requests BOTTLES DRAWN AEROBIC AND ANAEROBIC 3 CC EACH   Final   Culture  Setup Time     Final   Value: 03/08/2014 01:17     Performed at [facility]   Culture     Final   Value:        BLOOD CULTURE RECEIVED NO GROWTH TO DATE CULTURE WILL BE HELD FOR 5 DAYS BEFORE ISSUING A FINAL NEGATIVE REPORT     Performed at [facility]   Report Status PENDING   Incomplete  MRSA PCR SCREENING     Status: None   Collection Time    03/08/14  8:14 AM      Result Value Ref Range Status   MRSA by PCR NEGATIVE  NEGATIVE Final   Comment:            The GeneXpert MRSA Assay (FDA     approved for NASAL specimens     only), is one component of a     comprehensive MRSA colonization     surveillance program. It is not     intended to diagnose MRSA     infection nor to guide or     monitor treatment for     MRSA infections.     Studies: US Abdomen Complete  03/08/2014   CLINICAL DATA:  Abnormal LFTs  EXAM: ULTRASOUND ABDOMEN COMPLETE  COMPARISON:  CT abdomen and pelvis 06/26/2008, renal ultrasound 11/29/2011  FINDINGS: Gallbladder:  Surgically absent  Common bile duct:  Diameter: Normal caliber 5 mm diameter  Liver:  Slightly increased echogenicity, can be seen with fatty infiltration and cirrhosis. Hepatic margins appear slightly nodular suggesting cirrhosis. No discrete hepatic mass  visualized. Hepatopetal portal venous  flow.  IVC:  Normal appearance  Pancreas:  Slightly in homogeneous. No discrete mass. Distal tail obscured by bowel gas.  Spleen:  Normal appearance, 10.9 cm length  Right Kidney:  Length: 12.9 cm. Normal cortical thickness and echogenicity. Mild hydronephrosis. No mass or shadowing calcification.  Left Kidney:  Length: 12.3 cm. Normal cortical thickness and echogenicity. Mild hydronephrosis. No mass or shadowing calcification.  Abdominal aorta:  Limited visualization due to bowel gas, visualized portion normal caliber.  Other findings:  No free-fluid.  Significantly distended urinary bladder, 15.3 x 10.7 x 10.7 cm which could account for observe mild BILATERAL hydronephrosis; patient unable to void therefore unable to assess for persistence of hydronephrosis following bladder emptying.  IMPRESSION: Post cholecystectomy.  Cirrhotic appearing liver.  Mild BILATERAL hydronephrosis, cannot exclude in part related to bladder distention, patient unable to void for postvoid reassessment of the kidneys.   Electronically Signed   By: Ulyses Southward M.D.   On: 03/08/2014 11:12   Dg Chest Port 1 View  03/09/2014   CLINICAL DATA:  Assess for airspace disease  EXAM: PORTABLE CHEST - 1 VIEW  COMPARISON:  Prior chest x-ray 03/07/2014  FINDINGS: Stable cardiac and mediastinal contours. Atherosclerotic calcifications are present within the transverse aorta. Diffuse mild interstitial prominence throughout the lungs represents an interval change compared 03/07/2014. Additionally, there is mild patchy opacity in the right lower lobe partially obscuring the right diaphragm. No pneumothorax or pleural effusion. No acute osseous abnormality.  IMPRESSION: 1. Diffuse mild interstitial prominence compared to 03/07/2014. Differential considerations include mild interstitial edema and atypical infection. 2. Patchy airspace opacity in the right lower lobe partially obscuring the right diaphragm may represent atelectasis or infiltrate  (pneumonia/pneumonitis). 3. Trace aortic atherosclerosis.   Electronically Signed   By: Malachy Moan M.D.   On: 03/09/2014 08:14    Scheduled Meds: . antiseptic oral rinse  7 mL Mouth Rinse BID  . insulin aspart  0-5 Units Subcutaneous QHS  . insulin aspart  0-9 Units Subcutaneous TID WC  . pantoprazole  40 mg Oral Daily  . piperacillin-tazobactam (ZOSYN)  IV  3.375 g Intravenous Q8H  . pregabalin  150 mg Oral BID  . sodium chloride  1,000 mL Intravenous Once  . sodium chloride  3 mL Intravenous Q12H  . sucralfate  1 g Oral TID  . vancomycin  1,250 mg Intravenous Q12H   Continuous Infusions:  Antibiotics Given (last 72 hours)   Date/Time Action Medication Dose Rate   03/08/14 0226 Given   vancomycin (VANCOCIN) IVPB 1000 mg/200 mL premix 1,000 mg 200 mL/hr   03/08/14 0226 Given   piperacillin-tazobactam (ZOSYN) IVPB 3.375 g 3.375 g 100 mL/hr   03/08/14 1610 Given   piperacillin-tazobactam (ZOSYN) IVPB 3.375 g 3.375 g 12.5 mL/hr   03/08/14 1540 Given   piperacillin-tazobactam (ZOSYN) IVPB 3.375 g 3.375 g 12.5 mL/hr   03/08/14 2130 Given   vancomycin (VANCOCIN) IVPB 1000 mg/200 mL premix 1,000 mg 200 mL/hr   03/08/14 2300 Given   piperacillin-tazobactam (ZOSYN) IVPB 3.375 g 3.375 g 12.5 mL/hr   03/09/14 0719 Given   piperacillin-tazobactam (ZOSYN) IVPB 3.375 g 3.375 g 12.5 mL/hr   03/09/14 1523 Given   piperacillin-tazobactam (ZOSYN) IVPB 3.375 g 3.375 g 12.5 mL/hr   03/09/14 1524 Given   vancomycin (VANCOCIN) 1,250 mg in sodium chloride 0.9 % 250 mL IVPB 1,250 mg 166.7 mL/hr   03/09/14 2352 Given   piperacillin-tazobactam (ZOSYN) IVPB 3.375 g 3.375 g 12.5 mL/hr  03/10/14 0456 Given   vancomycin (VANCOCIN) 1,250 mg in sodium chloride 0.9 % 250 mL IVPB 1,250 mg 166.7 mL/hr   03/10/14 1610 Given   piperacillin-tazobactam (ZOSYN) IVPB 3.375 g 3.375 g 12.5 mL/hr      Principal Problem:   Weakness Active Problems:   Hypotension   Acute respiratory failure with hypoxia    Acute renal failure    Time spent:    [facility]  [facility] Pager [phone]. If 7PM-7AM, please contact night-coverage at [email or web address], password [facility] 03/10/2014, 9:57 AM  LOS: 3 days

## 2014-03-11 LAB — BASIC METABOLIC PANEL
Anion gap: 10 (ref 5–15)
BUN: 6 mg/dL (ref 6–23)
CO2: 28 mEq/L (ref 19–32)
Calcium: 8.5 mg/dL (ref 8.4–10.5)
Chloride: 99 mEq/L (ref 96–112)
Creatinine, Ser: 0.91 mg/dL (ref 0.50–1.35)
GFR calc non Af Amer: 86 mL/min — ABNORMAL LOW (ref >90)
Glucose, Bld: 221 mg/dL — ABNORMAL HIGH (ref 70–99)
Potassium: 3.7 mEq/L (ref 3.7–5.3)
Sodium: 137 mEq/L (ref 137–147)

## 2014-03-11 LAB — CBC
HCT: 36.4 % — ABNORMAL LOW (ref 39.0–52.0)
Hemoglobin: 12.1 g/dL — ABNORMAL LOW (ref 13.0–17.0)
MCH: 31.5 pg (ref 26.0–34.0)
MCHC: 33.2 g/dL (ref 30.0–36.0)
MCV: 94.8 fL (ref 78.0–100.0)
Platelets: 94 10*3/uL — ABNORMAL LOW (ref 150–400)
RBC: 3.84 MIL/uL — ABNORMAL LOW (ref 4.22–5.81)
RDW: 13 % (ref 11.5–15.5)
WBC: 3.4 10*3/uL — AB (ref 4.0–10.5)

## 2014-03-11 LAB — GLUCOSE, CAPILLARY
Glucose-Capillary: 271 mg/dL — ABNORMAL HIGH (ref 70–99)
Glucose-Capillary: 150 mg/dL — ABNORMAL HIGH (ref 70–99)

## 2014-03-11 MED ORDER — LEVOFLOXACIN 500 MG PO TABS: 500.0000 mg | ORAL_TABLET | Freq: Every day | ORAL | Status: DC

## 2014-03-11 MED ORDER — SUCRALFATE 1 G PO TABS
1.0000 g | ORAL_TABLET | ORAL | Status: DC
  Filled 2014-03-11 (×3): qty 1

## 2014-03-11 NOTE — Progress Notes (Signed)
Discharge instructions reviewed with patient, questions answered, verbalized understanding.  Patient transported to front of hospital via nurse tech to be taken home by wife.  Patient in stable condition at time of discharge from [facility].

## 2014-03-11 NOTE — Discharge Summary (Signed)
Physician Discharge Summary  Xavier White ZOX:[id number] DOB: 09/15/1945 DOA: 03/07/2014  PCP: Dorrene German, MD  Admit date: 03/07/2014 Discharge date: 03/11/2014  Time spent: 35 minutes  Recommendations for Outpatient Follow-up:  1. Recommend repeat basic metabolic panel in one week-came in with acute kidney injury and was on lisinopril and metformin  2. Complete course of antibiotics 8/27 3. Needs education about diabetic diet 4. Needs re-consideration of ACe for Nephropathy Rx in future   Discharge Diagnoses:  Principal Problem:   Weakness Active Problems:   Hypotension   Acute respiratory failure with hypoxia   Acute renal failure   Discharge Condition: good  Diet recommendation: diabetic  Filed Weights   03/09/14 0500 03/10/14 0638 03/11/14 0526  Weight: 93.6 kg (206 lb 5.6 oz) 89.812 kg (198 lb) 87.816 kg (193 lb 9.6 oz)    History of present illness:  68 year old male known history diabetes, hyperlipidemia, GERD sudden onset weakness found to have blood pressure 93 over 50s MAXIMUM TEMPERATURE 101 O2 sats decreased and he had a mild leukocytosis 12.2 lactic acid of 3.44 with mild transaminase elevations as per history of present illness. Admitted with severe sepsis and started on broad-spectrum antibiotics   Hospital Course:   1. Sepsis with shock -required pressors and was in the ICU and transferred to telemetry 8/22, now BP stable  -source initially unclear but repeat CXR y 8/22 =  RLL infiltrate  -all cultures negative  -Lactic acid probably secondary to sepsis but could have been secondary to metformin -Stop Vanc/Zosyn, day 3 and start PO levaquin to complete a course 03/14/14  2. AKI  -due to sepsis  -resolved with IVF, treatment of above  -Resume his home metformin will need labs in about a week  3. DM with neuropathy  -SSI for now, resume metformin at DC  -He has cut back on his lisinopril on his own and PCP aware -Held Lisinopril on  d/c-discuss with PCP as OP when/if to resume  4. H/o Hep C with concern for cirrhosis noted on [place]  -FU with GI   5. Urinary retention with some hydronephrosis  -required I/O cath initially, no resolved, voiding without difficulty   6. Thrombocytopenia  -chronic, noted in 2013 too, likely due to cirrhosis   Consultations:  PCCM  Antibiotics:  Vanc/Zosyn 8/20-8/24 Levofloxacin 8/24-8/27   Discharge Exam: Filed Vitals:   03/11/14 0526  BP: 112/58  Pulse: 62  Temp: 98.7 F (37.1 C)  Resp: 18    General: eomi, ncat Cardiovascular: s1 s2 no m/r/g Respiratory: clear, no added sound  Discharge Instructions You were cared for by a hospitalist during your hospital stay. If you have any questions about your discharge medications or the care you received while you were in the hospital after you are discharged, you can call the unit and asked to speak with the hospitalist on call if the hospitalist that took care of you is not available. Once you are discharged, your primary care physician will handle any further medical issues. Please note that NO REFILLS for any discharge medications will be authorized once you are discharged, as it is imperative that you return to your primary care physician (or establish a relationship with a primary care physician if you do not have one) for your aftercare needs so that they can reassess your need for medications and monitor your lab values.     Medication List    ASK your doctor about these medications       hydrOXYzine  25 MG tablet  Commonly known as:  ATARAX/VISTARIL  Take 25 mg by mouth every 6 (six) hours as needed for itching.     lisinopril 40 MG tablet  Commonly known as:  PRINIVIL,ZESTRIL  Take 40 mg by mouth daily.     metFORMIN 500 MG tablet  Commonly known as:  GLUCOPHAGE  Take 500 mg by mouth 2 (two) times daily with a meal.     omeprazole 20 MG capsule  Commonly known as:  PRILOSEC  Take 20 mg by mouth daily.      oxyCODONE 15 MG immediate release tablet  Commonly known as:  ROXICODONE  Take 15 mg by mouth every 4 (four) hours as needed for pain.     pregabalin 75 MG capsule  Commonly known as:  LYRICA  Take 150 mg by mouth 2 (two) times daily.     simvastatin 20 MG tablet  Commonly known as:  ZOCOR  Take 20 mg by mouth every evening.     sucralfate 1 G tablet  Commonly known as:  CARAFATE  Take 1 g by mouth 3 (three) times daily.       Allergies  Allergen Reactions  . Gabapentin Other (See Comments)    Depressed drowsy   . Narcan [Naloxone] Other (See Comments)    Admitted to hospital Neuorologist says not to take medication        The results of significant diagnostics from this hospitalization (including imaging, microbiology, ancillary and laboratory) are listed below for reference.    Significant Diagnostic Studies: Ct Head Wo Contrast  03/08/2014   CLINICAL DATA:  Near syncope  EXAM: CT HEAD WITHOUT CONTRAST  TECHNIQUE: Contiguous axial images were obtained from the base of the skull through the vertex without intravenous contrast.  COMPARISON:  11/29/2011  FINDINGS: Skull and Sinuses:Chronic opacification of anterior right ethmoid air cells and the right frontal sinus. The right frontal ethmoidal recess appears stenotic. No evidence of intracranial or orbital complication  Orbits: No acute abnormality.  Brain: No evidence of acute abnormality, such as acute infarction, hemorrhage, hydrocephalus, or mass lesion/mass effect. Advanced generalized brain atrophy. Volume loss is essentially stable from 2013.  IMPRESSION: 1. No acute intracranial findings. 2. Chronic right frontoethmoid sinusitis. 3. Generalized brain atrophy.   Electronically Signed   By: Tiburcio Pea M.D.   On: 03/08/2014 02:11   US Abdomen Complete  03/08/2014   CLINICAL DATA:  Abnormal LFTs  EXAM: ULTRASOUND ABDOMEN COMPLETE  COMPARISON:  CT abdomen and pelvis 06/26/2008, renal ultrasound 11/29/2011  FINDINGS:  Gallbladder:  Surgically absent  Common bile duct:  Diameter: Normal caliber 5 mm diameter  Liver:  Slightly increased echogenicity, can be seen with fatty infiltration and cirrhosis. Hepatic margins appear slightly nodular suggesting cirrhosis. No discrete hepatic mass visualized. Hepatopetal portal venous flow.  IVC:  Normal appearance  Pancreas:  Slightly in homogeneous. No discrete mass. Distal tail obscured by bowel gas.  Spleen:  Normal appearance, 10.9 cm length  Right Kidney:  Length: 12.9 cm. Normal cortical thickness and echogenicity. Mild hydronephrosis. No mass or shadowing calcification.  Left Kidney:  Length: 12.3 cm. Normal cortical thickness and echogenicity. Mild hydronephrosis. No mass or shadowing calcification.  Abdominal aorta:  Limited visualization due to bowel gas, visualized portion normal caliber.  Other findings:  No free-fluid.  Significantly distended urinary bladder, 15.3 x 10.7 x 10.7 cm which could account for observe mild BILATERAL hydronephrosis; patient unable to void therefore unable to assess for persistence of hydronephrosis following  bladder emptying.  IMPRESSION: Post cholecystectomy.  Cirrhotic appearing liver.  Mild BILATERAL hydronephrosis, cannot exclude in part related to bladder distention, patient unable to void for postvoid reassessment of the kidneys.   Electronically Signed   By: Ulyses Southward M.D.   On: 03/08/2014 11:12   Dg Chest Port 1 View  03/09/2014   CLINICAL DATA:  Assess for airspace disease  EXAM: PORTABLE CHEST - 1 VIEW  COMPARISON:  Prior chest x-ray 03/07/2014  FINDINGS: Stable cardiac and mediastinal contours. Atherosclerotic calcifications are present within the transverse aorta. Diffuse mild interstitial prominence throughout the lungs represents an interval change compared 03/07/2014. Additionally, there is mild patchy opacity in the right lower lobe partially obscuring the right diaphragm. No pneumothorax or pleural effusion. No acute osseous  abnormality.  IMPRESSION: 1. Diffuse mild interstitial prominence compared to 03/07/2014. Differential considerations include mild interstitial edema and atypical infection. 2. Patchy airspace opacity in the right lower lobe partially obscuring the right diaphragm may represent atelectasis or infiltrate (pneumonia/pneumonitis). 3. Trace aortic atherosclerosis.   Electronically Signed   By: Malachy Moan M.D.   On: 03/09/2014 08:14   Dg Chest Port 1 View  03/07/2014   CLINICAL DATA:  Fall, cough.  EXAM: PORTABLE CHEST - 1 VIEW  COMPARISON:  Chest radiograph Dec 06, 2011  FINDINGS: Cardiomediastinal silhouette is unremarkable for this low inspiratory portable examination with crowded vasculature markings. The lungs are clear without pleural effusions or focal consolidations. Trachea projects midline and there is no pneumothorax. Included soft tissue planes and osseous structures are non-suspicious. Multiple EKG lines overlie the patient and may obscure subtle underlying pathology.  IMPRESSION: No acute cardiopulmonary process.   Electronically Signed   By: Awilda Metro   On: 03/07/2014 22:49    Microbiology: Recent Results (from the past 240 hour(s))  URINE CULTURE     Status: None   Collection Time    03/07/14 10:20 PM      Result Value Ref Range Status   Specimen Description URINE, CLEAN CATCH   Final   Special Requests NONE   Final   Culture  Setup Time     Final   Value: 03/08/2014 02:39     Performed at [facility] Count     Final   Value: NO GROWTH     Performed at [facility]   Culture     Final   Value: NO GROWTH     Performed at [facility]   Report Status 03/09/2014 FINAL   Final  CULTURE, BLOOD (ROUTINE X 2)     Status: None   Collection Time    03/07/14 10:39 PM      Result Value Ref Range Status   Specimen Description BLOOD RIGHT ANTECUBITAL   Final   Special Requests BOTTLES DRAWN AEROBIC AND ANAEROBIC [facility] EACH   Final   Culture   Setup Time     Final   Value: 03/08/2014 02:40     Performed at [facility]   Culture     Final   Value:        BLOOD CULTURE RECEIVED NO GROWTH TO DATE CULTURE WILL BE HELD FOR 5 DAYS BEFORE ISSUING A FINAL NEGATIVE REPORT     Performed at [facility]   Report Status PENDING   Incomplete  CULTURE, BLOOD (ROUTINE X 2)     Status: None   Collection Time    03/07/14 10:39 PM  Result Value Ref Range Status   Specimen Description BLOOD LEFT HAND   Final   Special Requests BOTTLES DRAWN AEROBIC AND ANAEROBIC 3 CC EACH   Final   Culture  Setup Time     Final   Value: 03/08/2014 01:17     Performed at [facility]   Culture     Final   Value:        BLOOD CULTURE RECEIVED NO GROWTH TO DATE CULTURE WILL BE HELD FOR 5 DAYS BEFORE ISSUING A FINAL NEGATIVE REPORT     Performed at [facility]   Report Status PENDING   Incomplete  MRSA PCR SCREENING     Status: None   Collection Time    03/08/14  8:14 AM      Result Value Ref Range Status   MRSA by PCR NEGATIVE  NEGATIVE Final   Comment:            The GeneXpert MRSA Assay (FDA     approved for NASAL specimens     only), is one component of a     comprehensive MRSA colonization     surveillance program. It is not     intended to diagnose MRSA     infection nor to guide or     monitor treatment for     MRSA infections.     Labs: Basic Metabolic Panel:  Recent Labs Lab 03/07/14 2239 03/08/14 0634 03/09/14 0616 03/10/14 0555 03/11/14 0525  NA 135* 133* 138 140 137  K 4.5 4.2 4.4 3.9 3.7  CL 94* 98 103 102 99  CO2 GLUCOSE 171* 167* 209* 133* 221*  BUN CREATININE 3.47* 2.92* 1.20 0.98 0.91  CALCIUM 9.7 8.0* 8.2* 8.5 8.5  MG  --  1.9  --   --   --   PHOS  --  5.1*  --   --   --    Liver Function Tests:  Recent Labs Lab 03/07/14 2239 03/08/14 0634 03/09/14 0616  AST 86* 157* 212*  ALT 64* 58* 81*  ALKPHOS 126* 86 144*  BILITOT 0.8 0.9 0.6  PROT 8.2  5.9* 6.7  ALBUMIN 3.1* 2.1* 2.2*   No results found for this basename: LIPASE, AMYLASE,  in the last 168 hours No results found for this basename: AMMONIA,  in the last 168 hours CBC:  Recent Labs Lab 03/07/14 2239 03/08/14 0634 03/09/14 0616 03/10/14 0555 03/11/14 0525  WBC 12.2* 10.5 4.6 3.8* 3.4*  NEUTROABS 9.8* 7.2  --   --   --   HGB 13.7 11.1* 12.5* 12.7* 12.1*  HCT 41.9 34.2* 38.4* 38.8* 36.4*  MCV 94.6 95.3 95.8 94.6 94.8  PLT 156 115* 114* 102* 94*   Cardiac Enzymes:  Recent Labs Lab 03/07/14 2239 03/08/14 0025 03/08/14 0634 03/08/14 1206  TROPONINI <0.30 <0.30 <0.30 <0.30   BNP: BNP (last 3 results) No results found for this basename: PROBNP,  in the last 8760 hours CBG:  Recent Labs Lab 03/10/14 0707 03/10/14 1149 03/10/14 1706 03/10/14 2119 03/11/14 0730  GLUCAP 146* 204* 127* 271* 150*       Signed:  Rhetta Mura  [facility] 03/11/2014, 9:20 AM

## 2014-03-14 LAB — CULTURE, BLOOD (ROUTINE X 2)
CULTURE: NO GROWTH
Culture: NO GROWTH

## 2014-03-29 ENCOUNTER — Other Ambulatory Visit (HOSPITAL_COMMUNITY): Payer: Self-pay | Admitting: Internal Medicine

## 2014-03-29 DIAGNOSIS — M79606 Pain in leg, unspecified: Secondary | ICD-10-CM

## 2014-04-01 ENCOUNTER — Ambulatory Visit (HOSPITAL_COMMUNITY)
Admission: RE | Admit: 2014-04-01 | Discharge: 2014-04-01 | Disposition: A | Discharge status: Home / Self Care | Payer: Medicare Other | Source: Ambulatory Visit | Attending: Internal Medicine | Admitting: Internal Medicine

## 2014-04-01 DIAGNOSIS — M79606 Pain in leg, unspecified: Secondary | ICD-10-CM

## 2014-04-01 DIAGNOSIS — M79609 Pain in unspecified limb: Secondary | ICD-10-CM | POA: Insufficient documentation

## 2014-04-01 NOTE — Progress Notes (Signed)
VASCULAR LAB PRELIMINARY  ARTERIAL  ABI completed:    RIGHT    LEFT    PRESSURE WAVEFORM  PRESSURE WAVEFORM  BRACHIAL 124 Triphasic BRACHIAL 137 Triphasic  DP 150 Triphasic DP 155 Triphasic  PT 123 Triphasic PT 147 Triphasic    RIGHT LEFT  ABI 1.09 1.13   ABIs and Doppler waveforms are within normal limits bilaterally.  Duplex scan revealed minimal to mild diffuse heterogeneous plaque throughout with no evidence of significant stenosis  Sukanya Goldblatt, RVS 04/01/2014, 1:17 PM

## 2014-05-24 ENCOUNTER — Encounter: Payer: Self-pay | Admitting: Diagnostic Neuroimaging

## 2014-05-24 ENCOUNTER — Ambulatory Visit (INDEPENDENT_AMBULATORY_CARE_PROVIDER_SITE_OTHER): Payer: Medicare Other | Admitting: Diagnostic Neuroimaging

## 2014-05-24 VITALS — BP 127/83 | HR 73 | Ht 62.0 in | Wt 192.0 lb

## 2014-05-24 DIAGNOSIS — M5416 Radiculopathy, lumbar region: Secondary | ICD-10-CM

## 2014-05-24 DIAGNOSIS — E1142 Type 2 diabetes mellitus with diabetic polyneuropathy: Secondary | ICD-10-CM

## 2014-05-24 NOTE — Progress Notes (Signed)
[facility]  PATIENT: Xavier White DOB: [birth year]  REFERRING CLINICIAN: Avbuere HISTORY FROM: patient and wife  REASON FOR VISIT: new consult   HISTORICAL  CHIEF COMPLAINT:  Chief Complaint  Patient presents with  . Extremity Weakness    feet and legs    HISTORY OF PRESENT ILLNESS:   NEW HPI (05/24/14): 68 year old right-handed male with hepatitis C, diabetes, hypercholesterolemia, here for evaluation of neuropathy. Patient reports significant numbness, pain, tingling, burning and electrical sensation in his feet extending up to his knees and thighs. He has had this problem for over 5 years. Patient also having ongoing balance difficulty, memory problems, confusion. Has struggled with chronic pain related to low back pain, low back surgeries for several years. Patient has tried gabapentin in the past but this made him depressed. Now tried Lyrica up to 6 mg a day without relief. He never tried Cymbalta. He was prescribed amitriptyline 25 mg recently but has not picked up the prescription. He takes over-the-counter neuropathy cream. He scheduled to meet with a new pain doctor in a few weeks.  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 Patient has long history of chronic low back pain, previously on high-dose narcotics. In May 2013, patient became confused, fell down and was taken to the hospital. He was found to have acute renal failure, encephalopathy, possible sepsis, respiratory failure requiring mechanical ventilation. Ultimately he was extubated, and his pain medications were adjusted. Patient left the hospital AGAINST MEDICAL ADVICE. Since that time he has had ongoing gait and balance difficulty, memory problems, and a blank appearance according to the wife. Still  struggles with chronic pain and poor sleep.   REVIEW OF SYSTEMS: Full 14 system review of systems performed and notable only for memory loss confusion numbness weakness dizziness insomnia decreased energy joint pain cramps fatigue.    ALLERGIES: Allergies  Allergen Reactions  . Gabapentin Other (See Comments)    Depressed drowsy   . Narcan [Naloxone] Other (See Comments)    Admitted to hospital Neuorologist says not to take medication      HOME MEDICATIONS: Outpatient Prescriptions Prior to Visit  Medication Sig Dispense Refill  . metFORMIN (GLUCOPHAGE) 500 MG tablet Take 500 mg by mouth 2 (two) times daily with a meal.    . simvastatin (ZOCOR) 20 MG tablet Take 20 mg by mouth every evening.    . hydrOXYzine (ATARAX/VISTARIL) 25 MG tablet Take 25 mg by mouth every 6 (six) hours as needed for itching.     Marland Kitchen. levofloxacin (LEVAQUIN) 500 MG tablet Take 1 tablet (500 mg total) by mouth daily. 3 tablet 0  . omeprazole (PRILOSEC) 20 MG capsule Take 20 mg by mouth daily.    Marland Kitchen. oxyCODONE (ROXICODONE) 15 MG immediate release tablet Take 15 mg by mouth every 4 (four) hours as needed for pain.    . pregabalin (LYRICA) 75 MG capsule Take 150 mg by mouth 2 (two) times daily.    . sucralfate (CARAFATE) 1 G tablet Take 1 g by mouth 3 (three) times daily.      No facility-administered medications prior to visit.    PAST MEDICAL HISTORY: Past Medical History  Diagnosis Date  . Hepatitis C   . Diabetes mellitus   . Hypertension   . Neuropathy     feet  . Bronchitis, chronic   . Neuropathy   .  Diabetes   . High cholesterol     PAST SURGICAL HISTORY: Past Surgical History  Procedure Laterality Date  . Back surgery      5  . Cholecystectomy    . Spinal cord stimulator insertion    . Shoulder surgery      FAMILY HISTORY: Family History  Problem Relation Age of Onset  . Emphysema Mother   . Esophageal cancer Father     SOCIAL HISTORY:  History   Social History  . Marital  Status: Married    Spouse Name: N/A    Number of Children: 6  . Years of Education: HS   Occupational History  .  Other    disable   Social History Main Topics  . Smoking status: Former Smoker -- 1.50 packs/day for 40 years    Types: Cigarettes  . Smokeless tobacco: Never Used  . Alcohol Use: No  . Drug Use: Yes  . Sexual Activity: No   Other Topics Concern  . Not on file   Social History Narrative   Patient lives at home with his family.   Caffeine Use: .5 to 1 cup daily     PHYSICAL EXAM  Filed Vitals:   05/24/14 1128  BP: 127/83  Pulse: 73  Height: 5\' 2"  (1.575 m)  Weight: 192 lb (87.091 kg)    Body mass index is 35.11 kg/(m^2).   Visual Acuity Screening   Right eye Left eye Both eyes  Without correction: 20/200 20/200   With correction:       No flowsheet data found.  GENERAL EXAM: RESTLESS, MOD DISTRESS FROM PAIN IN BACK AND LEGS. Well developed, nourished and groomed; neck is supple  CARDIOVASCULAR: Regular rate and rhythm, no murmurs, no carotid bruits  NEUROLOGIC: MENTAL STATUS: awake, alert, oriented to person, place and time, recent and remote memory intact, normal attention and concentration, language fluent, comprehension intact, naming intact, fund of knowledge appropriate CRANIAL NERVE: no papilledema on fundoscopic exam, pupils equal and reactive to light, visual fields full to confrontation, extraocular muscles intact, no nystagmus, facial sensation and strength symmetric, hearing intact, palate elevates symmetrically, uvula midline, shoulder shrug symmetric, tongue midline. MOTOR: normal bulk and tone, full strength in the BUE; LIMITED IN LOWER EXT DUE TO PAIN (4/5).  SENSORY: DECR LT, TEMP IN FEET/LEGS; ABSENT VIB AT TOES COORDINATION: finger-nose-finger, fine finger movements normal REFLEXES: BUE 2, KNEES 1, ANKLES TRACE GAIT/STATION: ANTALGIC GAIT; UNSTEADY, SLOW    DIAGNOSTIC DATA (LABS, IMAGING, TESTING) - I reviewed patient  records, labs, notes, testing and imaging myself where available.  Lab Results  Component Value Date   WBC 3.4* 03/11/2014   HGB 12.1* 03/11/2014   HCT 36.4* 03/11/2014   MCV 94.8 03/11/2014   PLT 94* 03/11/2014      Component Value Date/Time   NA 137 03/11/2014 0525   K 3.7 03/11/2014 0525   CL 99 03/11/2014 0525   CO2 28 03/11/2014 0525   GLUCOSE 221* 03/11/2014 0525   BUN 6 03/11/2014 0525   CREATININE 0.91 03/11/2014 0525   CALCIUM 8.5 03/11/2014 0525   PROT 6.7 03/09/2014 0616   ALBUMIN 2.2* 03/09/2014 0616   AST 212* 03/09/2014 0616   ALT 81* 03/09/2014 0616   ALKPHOS 144* 03/09/2014 0616   BILITOT 0.6 03/09/2014 0616   GFRNONAA 86* 03/11/2014 0525   GFRAA >90 03/11/2014 0525   Lab Results  Component Value Date   TRIG 196* 12/04/2011   Lab Results  Component Value Date  HGBA1C 7.3* 03/08/2014   No results found for: VITAMINB12 Lab Results  Component Value Date   TSH 1.340 03/08/2014    I reviewed images myself and agree with interpretation. 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 Left greater than right L3 nerve root impingement is possible. Difficult to confirm or exclude solid fusion at L3-4, although none was present definitely in 2013. Consider CT lumbar spine for further evaluation if concern for pseudarthrosis. Unremarkable appearing L4 through S1 fusion within limits of visualization due to susceptibility.    ASSESSMENT AND PLAN  68 y.o. year old male here with neuropathy pain due to diabetes and Hep C, and some contribution from lumbar radiculopathy and prior surgeries. Also with memory loss, confusion and chronic pain.   Ddx of gait diff: diabetic neuropathy + back pain + lumbar radiculopathy  Ddx of memory decline: metabolic, pain related, sleep related, neurodegenerative   PLAN: 1. Agree with  pain mgmt eval 2. Agree with amitriptyline  3. Consider PT eval in future as pain is better controlled  Return if symptoms worsen or fail to improve, for return to PCP.    Suanne Marker, MD 05/24/2014, 12:08 PM Certified in Neurology, Neurophysiology and Neuroimaging  [facility] [address] [place], [place] [address] [phone]

## 2014-05-24 NOTE — Patient Instructions (Signed)
Start amitriptyline.  Follow up with PCP and pain management clinic.

## 2014-07-30 DIAGNOSIS — Z7952 Long term (current) use of systemic steroids: Secondary | ICD-10-CM | POA: Diagnosis not present

## 2014-07-30 DIAGNOSIS — M4695 Unspecified inflammatory spondylopathy, thoracolumbar region: Secondary | ICD-10-CM | POA: Diagnosis not present

## 2014-07-30 DIAGNOSIS — Z888 Allergy status to other drugs, medicaments and biological substances status: Secondary | ICD-10-CM | POA: Diagnosis not present

## 2014-07-30 DIAGNOSIS — M47815 Spondylosis without myelopathy or radiculopathy, thoracolumbar region: Secondary | ICD-10-CM | POA: Diagnosis not present

## 2014-07-30 DIAGNOSIS — Z79899 Other long term (current) drug therapy: Secondary | ICD-10-CM | POA: Diagnosis not present

## 2014-07-30 DIAGNOSIS — M545 Low back pain: Secondary | ICD-10-CM | POA: Diagnosis not present

## 2014-07-30 DIAGNOSIS — M47817 Spondylosis without myelopathy or radiculopathy, lumbosacral region: Secondary | ICD-10-CM | POA: Diagnosis not present

## 2014-08-14 DIAGNOSIS — K219 Gastro-esophageal reflux disease without esophagitis: Secondary | ICD-10-CM | POA: Diagnosis not present

## 2014-08-14 DIAGNOSIS — E1140 Type 2 diabetes mellitus with diabetic neuropathy, unspecified: Secondary | ICD-10-CM | POA: Diagnosis not present

## 2014-08-14 DIAGNOSIS — J302 Other seasonal allergic rhinitis: Secondary | ICD-10-CM | POA: Diagnosis not present

## 2014-08-14 DIAGNOSIS — G5790 Unspecified mononeuropathy of unspecified lower limb: Secondary | ICD-10-CM | POA: Diagnosis not present

## 2014-08-14 DIAGNOSIS — I10 Essential (primary) hypertension: Secondary | ICD-10-CM | POA: Diagnosis not present

## 2014-08-28 DIAGNOSIS — M25511 Pain in right shoulder: Secondary | ICD-10-CM | POA: Diagnosis not present

## 2014-08-28 DIAGNOSIS — M4696 Unspecified inflammatory spondylopathy, lumbar region: Secondary | ICD-10-CM | POA: Diagnosis not present

## 2014-08-28 DIAGNOSIS — G894 Chronic pain syndrome: Secondary | ICD-10-CM | POA: Diagnosis not present

## 2014-08-28 DIAGNOSIS — M545 Low back pain: Secondary | ICD-10-CM | POA: Diagnosis not present

## 2014-10-24 DIAGNOSIS — E119 Type 2 diabetes mellitus without complications: Secondary | ICD-10-CM | POA: Diagnosis not present

## 2014-10-24 DIAGNOSIS — I10 Essential (primary) hypertension: Secondary | ICD-10-CM | POA: Diagnosis not present

## 2014-10-24 DIAGNOSIS — K219 Gastro-esophageal reflux disease without esophagitis: Secondary | ICD-10-CM | POA: Diagnosis not present

## 2014-10-24 DIAGNOSIS — M47817 Spondylosis without myelopathy or radiculopathy, lumbosacral region: Secondary | ICD-10-CM | POA: Diagnosis not present

## 2014-10-24 DIAGNOSIS — Z888 Allergy status to other drugs, medicaments and biological substances status: Secondary | ICD-10-CM | POA: Diagnosis not present

## 2014-10-24 DIAGNOSIS — M129 Arthropathy, unspecified: Secondary | ICD-10-CM | POA: Diagnosis not present

## 2014-11-13 DIAGNOSIS — F17200 Nicotine dependence, unspecified, uncomplicated: Secondary | ICD-10-CM | POA: Diagnosis not present

## 2014-11-13 DIAGNOSIS — J302 Other seasonal allergic rhinitis: Secondary | ICD-10-CM | POA: Diagnosis not present

## 2014-11-13 DIAGNOSIS — E1140 Type 2 diabetes mellitus with diabetic neuropathy, unspecified: Secondary | ICD-10-CM | POA: Diagnosis not present

## 2014-11-13 DIAGNOSIS — G894 Chronic pain syndrome: Secondary | ICD-10-CM | POA: Diagnosis not present

## 2014-11-13 DIAGNOSIS — J42 Unspecified chronic bronchitis: Secondary | ICD-10-CM | POA: Diagnosis not present

## 2014-11-13 DIAGNOSIS — I10 Essential (primary) hypertension: Secondary | ICD-10-CM | POA: Diagnosis not present

## 2014-11-22 DIAGNOSIS — E1142 Type 2 diabetes mellitus with diabetic polyneuropathy: Secondary | ICD-10-CM | POA: Diagnosis not present

## 2014-11-22 DIAGNOSIS — Z5181 Encounter for therapeutic drug level monitoring: Secondary | ICD-10-CM | POA: Diagnosis not present

## 2014-11-22 DIAGNOSIS — Z79899 Other long term (current) drug therapy: Secondary | ICD-10-CM | POA: Diagnosis not present

## 2014-11-22 DIAGNOSIS — M4696 Unspecified inflammatory spondylopathy, lumbar region: Secondary | ICD-10-CM | POA: Diagnosis not present

## 2015-01-17 DEATH — deceased
